# Patient Record
Sex: Female | Born: 1955 | Race: White | Hispanic: No | Marital: Married | Smoking: Never smoker
Health system: Southern US, Community
[De-identification: ages and names within clinical notes are randomized; demographics above are authoritative.]

## PROBLEM LIST (undated history)

## (undated) DIAGNOSIS — I700 Atherosclerosis of aorta: Secondary | ICD-10-CM

## (undated) DIAGNOSIS — C20 Malignant neoplasm of rectum: Secondary | ICD-10-CM

## (undated) DIAGNOSIS — R002 Palpitations: Secondary | ICD-10-CM

## (undated) DIAGNOSIS — Z803 Family history of malignant neoplasm of breast: Secondary | ICD-10-CM

## (undated) DIAGNOSIS — K219 Gastro-esophageal reflux disease without esophagitis: Secondary | ICD-10-CM

## (undated) DIAGNOSIS — Z86718 Personal history of other venous thrombosis and embolism: Secondary | ICD-10-CM

## (undated) DIAGNOSIS — Z8043 Family history of malignant neoplasm of testis: Secondary | ICD-10-CM

## (undated) DIAGNOSIS — R0789 Other chest pain: Secondary | ICD-10-CM

## (undated) DIAGNOSIS — I739 Peripheral vascular disease, unspecified: Secondary | ICD-10-CM

## (undated) DIAGNOSIS — Z800 Family history of malignant neoplasm of digestive organs: Secondary | ICD-10-CM

## (undated) DIAGNOSIS — C189 Malignant neoplasm of colon, unspecified: Secondary | ICD-10-CM

## (undated) HISTORY — DX: Malignant neoplasm of rectum: C20

## (undated) HISTORY — DX: Gastro-esophageal reflux disease without esophagitis: K21.9

## (undated) HISTORY — DX: Family history of malignant neoplasm of digestive organs: Z80.0

## (undated) HISTORY — DX: Atherosclerosis of aorta: I70.0

## (undated) HISTORY — DX: Family history of malignant neoplasm of testis: Z80.43

## (undated) HISTORY — DX: Family history of malignant neoplasm of breast: Z80.3

## (undated) HISTORY — PX: SMALL INTESTINE SURGERY: SHX150

## (undated) HISTORY — DX: Personal history of other venous thrombosis and embolism: Z86.718

## (undated) HISTORY — DX: Peripheral vascular disease, unspecified: I73.9

## (undated) HISTORY — DX: Other chest pain: R07.89

## (undated) HISTORY — DX: Malignant neoplasm of colon, unspecified: C18.9

## (undated) HISTORY — DX: Palpitations: R00.2

---

## 1998-06-11 ENCOUNTER — Other Ambulatory Visit: Admission: RE | Admit: 1998-06-11 | Discharge: 1998-06-11 | Payer: Self-pay | Admitting: Obstetrics & Gynecology

## 1998-10-16 ENCOUNTER — Encounter: Payer: Self-pay | Admitting: Obstetrics & Gynecology

## 1998-10-16 ENCOUNTER — Ambulatory Visit (HOSPITAL_COMMUNITY): Admission: RE | Admit: 1998-10-16 | Discharge: 1998-10-16 | Payer: Self-pay | Admitting: Obstetrics & Gynecology

## 1998-10-30 ENCOUNTER — Encounter: Payer: Self-pay | Admitting: Obstetrics & Gynecology

## 1998-10-30 ENCOUNTER — Ambulatory Visit (HOSPITAL_COMMUNITY): Admission: RE | Admit: 1998-10-30 | Discharge: 1998-10-30 | Payer: Self-pay | Admitting: Obstetrics & Gynecology

## 1998-12-11 ENCOUNTER — Ambulatory Visit (HOSPITAL_COMMUNITY): Admission: RE | Admit: 1998-12-11 | Discharge: 1998-12-11 | Payer: Self-pay | Admitting: Gastroenterology

## 1999-06-12 ENCOUNTER — Other Ambulatory Visit: Admission: RE | Admit: 1999-06-12 | Discharge: 1999-06-12 | Payer: Self-pay | Admitting: Obstetrics & Gynecology

## 1999-11-05 ENCOUNTER — Encounter: Payer: Self-pay | Admitting: Obstetrics & Gynecology

## 1999-11-05 ENCOUNTER — Ambulatory Visit (HOSPITAL_COMMUNITY): Admission: RE | Admit: 1999-11-05 | Discharge: 1999-11-05 | Payer: Self-pay | Admitting: Obstetrics & Gynecology

## 2000-07-23 ENCOUNTER — Other Ambulatory Visit: Admission: RE | Admit: 2000-07-23 | Discharge: 2000-07-23 | Payer: Self-pay | Admitting: Obstetrics & Gynecology

## 2000-12-22 ENCOUNTER — Encounter: Admission: RE | Admit: 2000-12-22 | Discharge: 2000-12-22 | Payer: Self-pay | Admitting: Obstetrics & Gynecology

## 2000-12-22 ENCOUNTER — Encounter: Payer: Self-pay | Admitting: Obstetrics & Gynecology

## 2001-06-29 ENCOUNTER — Encounter: Admission: RE | Admit: 2001-06-29 | Discharge: 2001-06-29 | Payer: Self-pay | Admitting: Obstetrics & Gynecology

## 2001-06-29 ENCOUNTER — Encounter: Payer: Self-pay | Admitting: Obstetrics & Gynecology

## 2001-09-06 ENCOUNTER — Other Ambulatory Visit: Admission: RE | Admit: 2001-09-06 | Discharge: 2001-09-06 | Payer: Self-pay | Admitting: Obstetrics & Gynecology

## 2002-03-13 ENCOUNTER — Encounter: Admission: RE | Admit: 2002-03-13 | Discharge: 2002-03-13 | Payer: Self-pay | Admitting: Obstetrics & Gynecology

## 2002-03-13 ENCOUNTER — Encounter: Payer: Self-pay | Admitting: Obstetrics & Gynecology

## 2002-09-20 ENCOUNTER — Other Ambulatory Visit: Admission: RE | Admit: 2002-09-20 | Discharge: 2002-09-20 | Payer: Self-pay | Admitting: Obstetrics & Gynecology

## 2003-03-29 ENCOUNTER — Encounter: Admission: RE | Admit: 2003-03-29 | Discharge: 2003-03-29 | Payer: Self-pay | Admitting: Obstetrics & Gynecology

## 2003-03-29 ENCOUNTER — Encounter: Payer: Self-pay | Admitting: Obstetrics & Gynecology

## 2003-10-10 ENCOUNTER — Other Ambulatory Visit: Admission: RE | Admit: 2003-10-10 | Discharge: 2003-10-10 | Payer: Self-pay | Admitting: Obstetrics & Gynecology

## 2004-04-08 ENCOUNTER — Encounter: Admission: RE | Admit: 2004-04-08 | Discharge: 2004-04-08 | Payer: Self-pay | Admitting: Obstetrics & Gynecology

## 2004-11-05 ENCOUNTER — Other Ambulatory Visit: Admission: RE | Admit: 2004-11-05 | Discharge: 2004-11-05 | Payer: Self-pay | Admitting: Obstetrics & Gynecology

## 2005-05-01 ENCOUNTER — Encounter: Admission: RE | Admit: 2005-05-01 | Discharge: 2005-05-01 | Payer: Self-pay | Admitting: Obstetrics & Gynecology

## 2007-04-13 ENCOUNTER — Encounter: Admission: RE | Admit: 2007-04-13 | Discharge: 2007-04-13 | Payer: Self-pay | Admitting: Obstetrics & Gynecology

## 2008-07-02 ENCOUNTER — Encounter: Admission: RE | Admit: 2008-07-02 | Discharge: 2008-07-02 | Payer: Self-pay | Admitting: Obstetrics & Gynecology

## 2009-07-05 ENCOUNTER — Encounter: Admission: RE | Admit: 2009-07-05 | Discharge: 2009-07-05 | Payer: Self-pay | Admitting: Obstetrics & Gynecology

## 2009-08-17 DIAGNOSIS — C189 Malignant neoplasm of colon, unspecified: Secondary | ICD-10-CM

## 2009-08-17 DIAGNOSIS — Z9221 Personal history of antineoplastic chemotherapy: Secondary | ICD-10-CM

## 2009-08-17 HISTORY — DX: Personal history of antineoplastic chemotherapy: Z92.21

## 2009-08-17 HISTORY — DX: Malignant neoplasm of colon, unspecified: C18.9

## 2009-08-29 ENCOUNTER — Encounter: Payer: Self-pay | Admitting: Cardiology

## 2009-09-18 ENCOUNTER — Encounter: Payer: Self-pay | Admitting: Cardiology

## 2009-09-19 ENCOUNTER — Ambulatory Visit: Payer: Self-pay | Admitting: Cardiology

## 2009-09-19 DIAGNOSIS — R079 Chest pain, unspecified: Secondary | ICD-10-CM | POA: Insufficient documentation

## 2009-09-19 DIAGNOSIS — F988 Other specified behavioral and emotional disorders with onset usually occurring in childhood and adolescence: Secondary | ICD-10-CM | POA: Insufficient documentation

## 2009-09-19 DIAGNOSIS — R002 Palpitations: Secondary | ICD-10-CM | POA: Insufficient documentation

## 2009-09-19 DIAGNOSIS — R011 Cardiac murmur, unspecified: Secondary | ICD-10-CM | POA: Insufficient documentation

## 2009-09-20 ENCOUNTER — Observation Stay (HOSPITAL_COMMUNITY): Admission: EM | Admit: 2009-09-20 | Discharge: 2009-09-20 | Payer: Self-pay | Admitting: Emergency Medicine

## 2009-09-20 ENCOUNTER — Ambulatory Visit: Payer: Self-pay | Admitting: Internal Medicine

## 2009-10-14 ENCOUNTER — Encounter: Payer: Self-pay | Admitting: Cardiology

## 2009-10-14 ENCOUNTER — Ambulatory Visit (HOSPITAL_COMMUNITY): Admission: RE | Admit: 2009-10-14 | Discharge: 2009-10-14 | Payer: Self-pay | Admitting: Cardiology

## 2009-10-14 ENCOUNTER — Ambulatory Visit: Payer: Self-pay | Admitting: Internal Medicine

## 2009-10-14 ENCOUNTER — Ambulatory Visit: Payer: Self-pay

## 2009-10-15 ENCOUNTER — Ambulatory Visit: Payer: Self-pay | Admitting: Cardiology

## 2009-11-01 ENCOUNTER — Ambulatory Visit: Payer: Self-pay | Admitting: Cardiology

## 2009-11-06 ENCOUNTER — Telehealth: Payer: Self-pay | Admitting: Cardiology

## 2009-11-11 ENCOUNTER — Ambulatory Visit: Payer: Self-pay | Admitting: Cardiology

## 2009-11-20 ENCOUNTER — Ambulatory Visit: Payer: Self-pay | Admitting: Cardiology

## 2009-11-21 ENCOUNTER — Encounter: Payer: Self-pay | Admitting: Cardiology

## 2009-11-22 ENCOUNTER — Encounter: Payer: Self-pay | Admitting: Cardiology

## 2009-12-20 ENCOUNTER — Ambulatory Visit: Payer: Self-pay | Admitting: Cardiology

## 2009-12-31 ENCOUNTER — Ambulatory Visit: Payer: Self-pay | Admitting: Hematology and Oncology

## 2010-01-01 ENCOUNTER — Telehealth (INDEPENDENT_AMBULATORY_CARE_PROVIDER_SITE_OTHER): Payer: Self-pay | Admitting: *Deleted

## 2010-01-03 LAB — CBC WITH DIFFERENTIAL/PLATELET
BASO%: 0.2 % (ref 0.0–2.0)
EOS%: 0.5 % (ref 0.0–7.0)
HCT: 36.3 % (ref 34.8–46.6)
MCH: 30.7 pg (ref 25.1–34.0)
MCHC: 34.7 g/dL (ref 31.5–36.0)
NEUT%: 67.8 % (ref 38.4–76.8)
RBC: 4.1 10*6/uL (ref 3.70–5.45)
lymph#: 1.4 10*3/uL (ref 0.9–3.3)

## 2010-01-03 LAB — COMPREHENSIVE METABOLIC PANEL
ALT: 10 U/L (ref 0–35)
AST: 11 U/L (ref 0–37)
Calcium: 9.9 mg/dL (ref 8.4–10.5)
Chloride: 104 mEq/L (ref 96–112)
Creatinine, Ser: 0.87 mg/dL (ref 0.40–1.20)
Sodium: 139 mEq/L (ref 135–145)
Total Bilirubin: 0.7 mg/dL (ref 0.3–1.2)

## 2010-01-06 ENCOUNTER — Ambulatory Visit (HOSPITAL_COMMUNITY): Admission: RE | Admit: 2010-01-06 | Discharge: 2010-01-06 | Payer: Self-pay | Admitting: Hematology and Oncology

## 2010-01-07 ENCOUNTER — Ambulatory Visit: Admission: RE | Admit: 2010-01-07 | Discharge: 2010-03-07 | Payer: Self-pay | Admitting: Radiation Oncology

## 2010-01-08 ENCOUNTER — Ambulatory Visit (HOSPITAL_COMMUNITY): Admission: RE | Admit: 2010-01-08 | Discharge: 2010-01-08 | Payer: Self-pay | Admitting: Gastroenterology

## 2010-01-10 ENCOUNTER — Ambulatory Visit (HOSPITAL_COMMUNITY): Admission: RE | Admit: 2010-01-10 | Discharge: 2010-01-10 | Payer: Self-pay | Admitting: Hematology and Oncology

## 2010-01-21 ENCOUNTER — Ambulatory Visit (HOSPITAL_COMMUNITY): Admission: RE | Admit: 2010-01-21 | Discharge: 2010-01-21 | Payer: Self-pay | Admitting: Hematology and Oncology

## 2010-01-27 LAB — BASIC METABOLIC PANEL
BUN: 12 mg/dL (ref 6–23)
Calcium: 9.2 mg/dL (ref 8.4–10.5)
Chloride: 105 mEq/L (ref 96–112)
Creatinine, Ser: 0.69 mg/dL (ref 0.40–1.20)

## 2010-01-27 LAB — CBC WITH DIFFERENTIAL/PLATELET
BASO%: 0.4 % (ref 0.0–2.0)
Basophils Absolute: 0 10*3/uL (ref 0.0–0.1)
EOS%: 1.5 % (ref 0.0–7.0)
HCT: 36.3 % (ref 34.8–46.6)
HGB: 12.4 g/dL (ref 11.6–15.9)
LYMPH%: 24 % (ref 14.0–49.7)
MCH: 29.7 pg (ref 25.1–34.0)
MCHC: 34.2 g/dL (ref 31.5–36.0)
MCV: 87.1 fL (ref 79.5–101.0)
NEUT%: 65.7 % (ref 38.4–76.8)
Platelets: 263 10*3/uL (ref 145–400)
lymph#: 1.7 10*3/uL (ref 0.9–3.3)

## 2010-01-30 ENCOUNTER — Ambulatory Visit: Payer: Self-pay | Admitting: Hematology and Oncology

## 2010-02-03 LAB — CBC WITH DIFFERENTIAL/PLATELET
EOS%: 2.1 % (ref 0.0–7.0)
Eosinophils Absolute: 0.1 10*3/uL (ref 0.0–0.5)
HGB: 12.4 g/dL (ref 11.6–15.9)
MCH: 29.9 pg (ref 25.1–34.0)
MCV: 87.5 fL (ref 79.5–101.0)
MONO%: 9 % (ref 0.0–14.0)
NEUT#: 3.8 10*3/uL (ref 1.5–6.5)
RBC: 4.15 10*6/uL (ref 3.70–5.45)
RDW: 12.6 % (ref 11.2–14.5)
lymph#: 0.8 10*3/uL — ABNORMAL LOW (ref 0.9–3.3)

## 2010-02-03 LAB — BASIC METABOLIC PANEL
Chloride: 104 mEq/L (ref 96–112)
Potassium: 3.8 mEq/L (ref 3.5–5.3)
Sodium: 137 mEq/L (ref 135–145)

## 2010-02-10 LAB — BASIC METABOLIC PANEL
BUN: 14 mg/dL (ref 6–23)
CO2: 24 mEq/L (ref 19–32)
Calcium: 9.2 mg/dL (ref 8.4–10.5)
Creatinine, Ser: 0.74 mg/dL (ref 0.40–1.20)
Glucose, Bld: 103 mg/dL — ABNORMAL HIGH (ref 70–99)
Sodium: 140 mEq/L (ref 135–145)

## 2010-02-10 LAB — CBC WITH DIFFERENTIAL/PLATELET
Basophils Absolute: 0 10*3/uL (ref 0.0–0.1)
Eosinophils Absolute: 0.1 10*3/uL (ref 0.0–0.5)
HCT: 34.2 % — ABNORMAL LOW (ref 34.8–46.6)
LYMPH%: 11.5 % — ABNORMAL LOW (ref 14.0–49.7)
MCV: 89 fL (ref 79.5–101.0)
MONO#: 0.5 10*3/uL (ref 0.1–0.9)
NEUT#: 3.3 10*3/uL (ref 1.5–6.5)
NEUT%: 75.3 % (ref 38.4–76.8)
Platelets: 242 10*3/uL (ref 145–400)
WBC: 4.4 10*3/uL (ref 3.9–10.3)

## 2010-02-18 LAB — CBC WITH DIFFERENTIAL/PLATELET
BASO%: 0.2 % (ref 0.0–2.0)
EOS%: 2.3 % (ref 0.0–7.0)
HCT: 34 % — ABNORMAL LOW (ref 34.8–46.6)
LYMPH%: 6.9 % — ABNORMAL LOW (ref 14.0–49.7)
MCH: 30.1 pg (ref 25.1–34.0)
MCHC: 34.1 g/dL (ref 31.5–36.0)
MCV: 88.3 fL (ref 79.5–101.0)
MONO%: 12.4 % (ref 0.0–14.0)
NEUT%: 78.2 % — ABNORMAL HIGH (ref 38.4–76.8)
lymph#: 0.4 10*3/uL — ABNORMAL LOW (ref 0.9–3.3)

## 2010-02-18 LAB — BASIC METABOLIC PANEL
Calcium: 9.7 mg/dL (ref 8.4–10.5)
Chloride: 104 mEq/L (ref 96–112)
Creatinine, Ser: 0.81 mg/dL (ref 0.40–1.20)

## 2010-02-24 LAB — BASIC METABOLIC PANEL
BUN: 14 mg/dL (ref 6–23)
Chloride: 103 mEq/L (ref 96–112)
Glucose, Bld: 100 mg/dL — ABNORMAL HIGH (ref 70–99)
Potassium: 4.3 mEq/L (ref 3.5–5.3)
Sodium: 138 mEq/L (ref 135–145)

## 2010-02-24 LAB — CBC WITH DIFFERENTIAL/PLATELET
Basophils Absolute: 0 10*3/uL (ref 0.0–0.1)
Eosinophils Absolute: 0.1 10*3/uL (ref 0.0–0.5)
HGB: 11.6 g/dL (ref 11.6–15.9)
MONO#: 0.7 10*3/uL (ref 0.1–0.9)
NEUT#: 4.2 10*3/uL (ref 1.5–6.5)
RBC: 3.64 10*6/uL — ABNORMAL LOW (ref 3.70–5.45)
RDW: 15.3 % — ABNORMAL HIGH (ref 11.2–14.5)
WBC: 5.3 10*3/uL (ref 3.9–10.3)
lymph#: 0.2 10*3/uL — ABNORMAL LOW (ref 0.9–3.3)

## 2010-02-25 LAB — URINALYSIS, MICROSCOPIC - CHCC
Glucose: NEGATIVE g/dL
Ketones: NEGATIVE mg/dL
Nitrite: NEGATIVE
Specific Gravity, Urine: 1.02 (ref 1.003–1.035)
pH: 6 (ref 4.6–8.0)

## 2010-03-03 ENCOUNTER — Ambulatory Visit: Payer: Self-pay | Admitting: Hematology and Oncology

## 2010-03-05 LAB — CBC WITH DIFFERENTIAL/PLATELET
Basophils Absolute: 0 10*3/uL (ref 0.0–0.1)
Eosinophils Absolute: 0.4 10*3/uL (ref 0.0–0.5)
HGB: 12.2 g/dL (ref 11.6–15.9)
MCV: 92.6 fL (ref 79.5–101.0)
MONO#: 0.6 10*3/uL (ref 0.1–0.9)
MONO%: 13.2 % (ref 0.0–14.0)
NEUT#: 3.4 10*3/uL (ref 1.5–6.5)
Platelets: 350 10*3/uL (ref 145–400)
RBC: 3.84 10*6/uL (ref 3.70–5.45)
RDW: 17.6 % — ABNORMAL HIGH (ref 11.2–14.5)
WBC: 4.7 10*3/uL (ref 3.9–10.3)

## 2010-03-05 LAB — COMPREHENSIVE METABOLIC PANEL
ALT: 16 U/L (ref 0–35)
AST: 12 U/L (ref 0–37)
Alkaline Phosphatase: 43 U/L (ref 39–117)
BUN: 15 mg/dL (ref 6–23)
Chloride: 105 mEq/L (ref 96–112)
Creatinine, Ser: 1.04 mg/dL (ref 0.40–1.20)
Total Bilirubin: 0.5 mg/dL (ref 0.3–1.2)

## 2010-03-07 ENCOUNTER — Telehealth: Payer: Self-pay | Admitting: Cardiology

## 2010-04-01 ENCOUNTER — Ambulatory Visit (HOSPITAL_COMMUNITY): Admission: RE | Admit: 2010-04-01 | Discharge: 2010-04-01 | Payer: Self-pay | Admitting: Hematology and Oncology

## 2010-04-01 LAB — COMPREHENSIVE METABOLIC PANEL
ALT: 23 U/L (ref 0–35)
AST: 23 U/L (ref 0–37)
Calcium: 9.2 mg/dL (ref 8.4–10.5)
Chloride: 105 mEq/L (ref 96–112)
Creatinine, Ser: 0.76 mg/dL (ref 0.40–1.20)
Potassium: 3.8 mEq/L (ref 3.5–5.3)

## 2010-04-01 LAB — CEA: CEA: 0.7 ng/mL (ref 0.0–5.0)

## 2010-04-01 LAB — CBC WITH DIFFERENTIAL/PLATELET
BASO%: 0.3 % (ref 0.0–2.0)
MCHC: 35.3 g/dL (ref 31.5–36.0)
MONO#: 0.4 10*3/uL (ref 0.1–0.9)
RBC: 3.81 10*6/uL (ref 3.70–5.45)
WBC: 4.1 10*3/uL (ref 3.9–10.3)
lymph#: 0.4 10*3/uL — ABNORMAL LOW (ref 0.9–3.3)

## 2010-04-02 ENCOUNTER — Ambulatory Visit: Payer: Self-pay | Admitting: Hematology and Oncology

## 2010-06-04 ENCOUNTER — Ambulatory Visit: Payer: Self-pay | Admitting: Hematology and Oncology

## 2010-06-06 LAB — COMPREHENSIVE METABOLIC PANEL
Albumin: 3.9 g/dL (ref 3.5–5.2)
BUN: 11 mg/dL (ref 6–23)
CO2: 25 mEq/L (ref 19–32)
Calcium: 9.4 mg/dL (ref 8.4–10.5)
Chloride: 105 mEq/L (ref 96–112)
Glucose, Bld: 91 mg/dL (ref 70–99)
Potassium: 4 mEq/L (ref 3.5–5.3)
Sodium: 142 mEq/L (ref 135–145)
Total Protein: 6.3 g/dL (ref 6.0–8.3)

## 2010-06-06 LAB — CBC WITH DIFFERENTIAL/PLATELET
Basophils Absolute: 0 10*3/uL (ref 0.0–0.1)
Eosinophils Absolute: 0.1 10*3/uL (ref 0.0–0.5)
HGB: 10.7 g/dL — ABNORMAL LOW (ref 11.6–15.9)
MCV: 88 fL (ref 79.5–101.0)
MONO#: 0.4 10*3/uL (ref 0.1–0.9)
NEUT#: 2.9 10*3/uL (ref 1.5–6.5)
Platelets: 381 10*3/uL (ref 145–400)
RBC: 3.59 10*6/uL — ABNORMAL LOW (ref 3.70–5.45)
RDW: 13.7 % (ref 11.2–14.5)
WBC: 3.8 10*3/uL — ABNORMAL LOW (ref 3.9–10.3)

## 2010-06-06 LAB — CEA: CEA: <0.5 ng/mL (ref 0.0–5.0)

## 2010-07-04 ENCOUNTER — Ambulatory Visit: Payer: Self-pay | Admitting: Hematology and Oncology

## 2010-07-07 ENCOUNTER — Encounter: Admission: RE | Admit: 2010-07-07 | Discharge: 2010-07-07 | Payer: Self-pay | Admitting: Obstetrics & Gynecology

## 2010-07-08 LAB — CBC WITH DIFFERENTIAL/PLATELET
BASO%: 0.2 % (ref 0.0–2.0)
EOS%: 2.6 % (ref 0.0–7.0)
HCT: 31.6 % — ABNORMAL LOW (ref 34.8–46.6)
MCH: 29.5 pg (ref 25.1–34.0)
MCHC: 34.3 g/dL (ref 31.5–36.0)
MONO#: 0.3 10*3/uL (ref 0.1–0.9)
NEUT%: 69.8 % (ref 38.4–76.8)
RBC: 3.69 10*6/uL — ABNORMAL LOW (ref 3.70–5.45)
RDW: 14.1 % (ref 11.2–14.5)
WBC: 3.4 10*3/uL — ABNORMAL LOW (ref 3.9–10.3)
lymph#: 0.6 10*3/uL — ABNORMAL LOW (ref 0.9–3.3)

## 2010-07-09 LAB — COMPREHENSIVE METABOLIC PANEL
ALT: 8 U/L (ref 0–35)
AST: 13 U/L (ref 0–37)
Calcium: 9.3 mg/dL (ref 8.4–10.5)
Chloride: 105 mEq/L (ref 96–112)
Creatinine, Ser: 0.79 mg/dL (ref 0.40–1.20)
Sodium: 140 mEq/L (ref 135–145)
Total Protein: 6.3 g/dL (ref 6.0–8.3)

## 2010-07-29 LAB — CBC WITH DIFFERENTIAL/PLATELET
Basophils Absolute: 0 10*3/uL (ref 0.0–0.1)
EOS%: 4.5 % (ref 0.0–7.0)
HCT: 33.9 % — ABNORMAL LOW (ref 34.8–46.6)
HGB: 11.6 g/dL (ref 11.6–15.9)
MCH: 29.8 pg (ref 25.1–34.0)
MCV: 86.7 fL (ref 79.5–101.0)
MONO%: 9.1 % (ref 0.0–14.0)
NEUT%: 72.6 % (ref 38.4–76.8)

## 2010-07-29 LAB — COMPREHENSIVE METABOLIC PANEL
ALT: 19 U/L (ref 0–35)
AST: 18 U/L (ref 0–37)
Albumin: 4.1 g/dL (ref 3.5–5.2)
BUN: 11 mg/dL (ref 6–23)
CO2: 24 mEq/L (ref 19–32)
Calcium: 9.2 mg/dL (ref 8.4–10.5)
Chloride: 107 mEq/L (ref 96–112)
Creatinine, Ser: 0.87 mg/dL (ref 0.40–1.20)
Potassium: 3.8 mEq/L (ref 3.5–5.3)

## 2010-08-07 ENCOUNTER — Ambulatory Visit (HOSPITAL_BASED_OUTPATIENT_CLINIC_OR_DEPARTMENT_OTHER): Payer: BC Managed Care – PPO | Admitting: Hematology and Oncology

## 2010-08-08 LAB — COMPREHENSIVE METABOLIC PANEL
ALT: 11 U/L (ref 0–35)
AST: 17 U/L (ref 0–37)
Albumin: 4.1 g/dL (ref 3.5–5.2)
CO2: 28 mEq/L (ref 19–32)
Calcium: 9.4 mg/dL (ref 8.4–10.5)
Chloride: 105 mEq/L (ref 96–112)
Potassium: 4.2 mEq/L (ref 3.5–5.3)
Sodium: 141 mEq/L (ref 135–145)
Total Protein: 6.3 g/dL (ref 6.0–8.3)

## 2010-08-08 LAB — CBC WITH DIFFERENTIAL/PLATELET
BASO%: 0.4 % (ref 0.0–2.0)
EOS%: 5.4 % (ref 0.0–7.0)
HCT: 32.2 % — ABNORMAL LOW (ref 34.8–46.6)
HGB: 11.2 g/dL — ABNORMAL LOW (ref 11.6–15.9)
MCH: 30.2 pg (ref 25.1–34.0)
MCHC: 34.7 g/dL (ref 31.5–36.0)
MONO#: 0.5 10*3/uL (ref 0.1–0.9)
NEUT%: 59 % (ref 38.4–76.8)
RDW: 22.2 % — ABNORMAL HIGH (ref 11.2–14.5)
WBC: 3.7 10*3/uL — ABNORMAL LOW (ref 3.9–10.3)
lymph#: 0.8 10*3/uL — ABNORMAL LOW (ref 0.9–3.3)

## 2010-08-29 LAB — CBC WITH DIFFERENTIAL/PLATELET
BASO%: 0.2 % (ref 0.0–2.0)
Basophils Absolute: 0 10*3/uL (ref 0.0–0.1)
EOS%: 4 % (ref 0.0–7.0)
Eosinophils Absolute: 0.1 10*3/uL (ref 0.0–0.5)
HCT: 34.4 % — ABNORMAL LOW (ref 34.8–46.6)
HGB: 11.8 g/dL (ref 11.6–15.9)
LYMPH%: 22.7 % (ref 14.0–49.7)
MCH: 30.6 pg (ref 25.1–34.0)
MCHC: 34.2 g/dL (ref 31.5–36.0)
MCV: 89.7 fL (ref 79.5–101.0)
MONO#: 0.4 10*3/uL (ref 0.1–0.9)
MONO%: 11.5 % (ref 0.0–14.0)
NEUT#: 2.2 10*3/uL (ref 1.5–6.5)
NEUT%: 61.6 % (ref 38.4–76.8)
Platelets: 262 10*3/uL (ref 145–400)
RBC: 3.83 10*6/uL (ref 3.70–5.45)
RDW: 25.5 % — ABNORMAL HIGH (ref 11.2–14.5)
WBC: 3.6 10*3/uL — ABNORMAL LOW (ref 3.9–10.3)
lymph#: 0.8 10*3/uL — ABNORMAL LOW (ref 0.9–3.3)

## 2010-08-29 LAB — COMPREHENSIVE METABOLIC PANEL
ALT: 11 U/L (ref 0–35)
AST: 15 U/L (ref 0–37)
Albumin: 4.5 g/dL (ref 3.5–5.2)
Alkaline Phosphatase: 62 U/L (ref 39–117)
BUN: 13 mg/dL (ref 6–23)
CO2: 22 mEq/L (ref 19–32)
Calcium: 9.3 mg/dL (ref 8.4–10.5)
Chloride: 106 mEq/L (ref 96–112)
Creatinine, Ser: 0.76 mg/dL (ref 0.40–1.20)
Glucose, Bld: 108 mg/dL — ABNORMAL HIGH (ref 70–99)
Potassium: 4.4 mEq/L (ref 3.5–5.3)
Sodium: 142 mEq/L (ref 135–145)
Total Bilirubin: 0.6 mg/dL (ref 0.3–1.2)
Total Protein: 6.7 g/dL (ref 6.0–8.3)

## 2010-09-14 ENCOUNTER — Emergency Department (HOSPITAL_COMMUNITY)
Admission: EM | Admit: 2010-09-14 | Discharge: 2010-09-14 | Payer: Self-pay | Source: Home / Self Care | Admitting: Emergency Medicine

## 2010-09-14 LAB — CBC
HCT: 33.9 % — ABNORMAL LOW (ref 36.0–46.0)
Hemoglobin: 11.9 g/dL — ABNORMAL LOW (ref 12.0–15.0)
MCHC: 35.1 g/dL (ref 30.0–36.0)
RDW: 21.4 % — ABNORMAL HIGH (ref 11.5–15.5)
WBC: 3.3 10*3/uL — ABNORMAL LOW (ref 4.0–10.5)

## 2010-09-14 LAB — DIFFERENTIAL
Basophils Absolute: 0 10*3/uL (ref 0.0–0.1)
Eosinophils Relative: 3 % (ref 0–5)
Lymphs Abs: 0.3 10*3/uL — ABNORMAL LOW (ref 0.7–4.0)
Monocytes Relative: 18 % — ABNORMAL HIGH (ref 3–12)
Neutrophils Relative %: 69 % (ref 43–77)

## 2010-09-14 LAB — COMPREHENSIVE METABOLIC PANEL
BUN: 6 mg/dL (ref 6–23)
CO2: 27 mEq/L (ref 19–32)
Calcium: 9.2 mg/dL (ref 8.4–10.5)
Chloride: 105 mEq/L (ref 96–112)
Creatinine, Ser: 0.89 mg/dL (ref 0.4–1.2)
GFR calc non Af Amer: >60 mL/min (ref >60)
Glucose, Bld: 122 mg/dL — ABNORMAL HIGH (ref 70–99)
Total Bilirubin: 0.8 mg/dL (ref 0.3–1.2)

## 2010-09-18 NOTE — Letter (Signed)
Summary: Generic Letter-letter for event monitor  [employer], Main Office  [address]   [place], [place] [address]   Phone: [phone]  Fax: [phone]        November 11, 2009 MRN: [id number]    Colleen Kirby [address] [place], [place]  [address]    A recent 48 hour monitor showed SVT vs atrial flutter. I would like for Ms. Zuleta  to have a 3 week event monitor to determine atrial flutter burden.      Sincerely,    Daltom McLean,MD  This letter has been electronically signed by your physician.

## 2010-09-18 NOTE — Progress Notes (Signed)
  Phone Note Call from Patient   Caller: PT Initial call taken by: Denny Peon    PT called @ 9:00 this am asking if Records were copied I let her know I would Speak W/ Gina and call he back..Talked w/ Almira Coaster she has records copied..Called Pt back @ 9:40 to let her know she can come Pick up Records, She stated she will pick up this afternoon. Colleen Kirby  Jan 01, 2010 9:47 AM

## 2010-09-18 NOTE — Consult Note (Signed)
Summary: Colleen Kirby at [facility] Referral Form   Imported By: Roderic Ovens 09/25/2009 13:30:29  _____________________________________________________________________  External Attachment:    Type:   Image     Comment:   External Document

## 2010-09-18 NOTE — Miscellaneous (Signed)
Summary: Orders Update  Clinical Lists Changes  Orders: Added new Test order of T- * Misc. Laboratory test [id number]) - Signed

## 2010-09-18 NOTE — Assessment & Plan Note (Signed)
Summary: rov   Primary Provider:  Dr. Zachery Dauer @ [facility]   History of Present Illness: 55 yo presented for evaluation of chest pain and palpitations. Stress echocardiogram was normal.  She was noted on 48 hour holter monitor to have episode of SVT (possible atrial flutter) at a rate of 150 bpm. She continues to have occasional palpitations (mild heart fluttering) as well as twinges of atypical chest pain.  She has been avoiding caffeine and over-the-counter cold meds. She is very worried about the possibility of atrial fibrillation (her mother had this).     Current Medications (verified): 1)  Aspirin 81 Mg  Tabs (Aspirin) .Marland Kitchen.. 1 Tab Qd 2)  Protonix 40 Mg Tbec (Pantoprazole Sodium) .... Daily 3)  Amoxicillin .Marland Kitchen.. 1 Tab By Mouth Two Times A Day For 3 More Days  Allergies: No Known Drug Allergies  Past History:  Past Medical History: 1. GERD 2. Atypical chest pain: Stress echo (2/11) showed EF 60%, normal valves and normal RV.  There was no evidence for ischemia with stress: all walls augmented appropriately.    3. Palpitations: Holter monitor (3/11) showed 1 run of SVT (possible atrial fiutter) at a rate of 150 bpm.   4. DVT several years ago when on HRT.  Took coumadin for a few months.   Family History: Reviewed history from 10/15/2009 and no changes required. Father was found to have CAD in his early 67s and had CABG at 67.  Grandfather had CABG at 72.  2 brothers are healthy.  Mother with atrial fibrillation.   Social History: Reviewed history from 10/15/2009 and no changes required. Married, not working Tobacco Use - No.  Alcohol Use - no  Review of Systems       All systems reviewed and negative except as per HPI.   Vital Signs:  Patient profile:   55 year old female Height:      66 inches Weight:      160 pounds BMI:     25.92 Pulse rate:   90 / minute Resp:     12 per minute BP sitting:   113 / 74  (left arm)  Vitals Entered By: Kem Parkinson (November 11, 2009  10:23 AM)  Physical Exam  General:  Well developed, well nourished, in no acute distress. Neck:  Neck supple, no JVD. No masses, thyromegaly or abnormal cervical nodes. Lungs:  Clear bilaterally to auscultation and percussion. Heart:  Non-displaced PMI, chest non-tender; regular rate and rhythm, S1, S2 without rubs or gallops. 1/6 systolic murmur at the upper sternal border.  Carotid upstroke normal, no bruit. Pedals normal pulses. No edema, no varicosities. Abdomen:  Bowel sounds positive; abdomen soft and non-tender without masses, organomegaly, or hernias noted. No hepatosplenomegaly. Extremities:  No clubbing or cyanosis. Neurologic:  Alert and oriented x 3. Psych:  anxious.     Impression & Recommendations:  Problem # 1:  PALPITATIONS (ICD-785.1) Patient was found to have a run of SVT (possible atrial flutter) at a rate of 150 bpm on holter monitor.  I would like to try to confirm the presence of atrial flutter and assess the flutter burden with a 3 week event monitor. Her CHADSVASC score is 0 so I will have her take ASA 325 mg daily.  I will also have her start Toprol XL 25 mg daily. I would like her to do the event monitor while on Toprol XL.   Problem # 2:  FAMILY HISTORY OF CAD Given the patient's family history of  premature CAD, I think it would be reasonable to look more closely into her lipid profile.  I will have her do a Lipomed profile.   Other Orders: Event (Event)  Patient Instructions: 1)  Your physician has recommended you make the following change in your medication:  2)  Increase Apsirin to 325mg  daily 3)  Start Toprol XL 25mg  daily 4)  Your physician has recommended that you wear an event monitor.  Event monitors are medical devices that record the heart's electrical activity. Doctors most often use these monitors to diagnose arrhythmias. Arrhythmias are problems with the speed or rhythm of the heartbeat. The monitor is a small, portable device. You can wear one  while you do your normal daily activities. This is usually used to diagnose what is causing palpitations/syncope (passing out).  HAVE THIS DONE AT LEAST ONE WEEK AFTER BEING ON TORPOL 5)  Your physician recommends that you return for a FASTING LIPOMED PROFILE---785.0 786.50 6)  Your physician recommends that you schedule a follow-up appointment with Dr Shirlee Latch in  4-5 weeks after the monitor has been completed. Prescriptions: TOPROL XL 25 MG XR24H-TAB (METOPROLOL SUCCINATE) one tablet daily  #30 x 6   Entered by:   Katina Dung, RN, BSN   Authorized by:   [name], MD   Signed by:   Katina Dung, RN, BSN on 11/11/2009   Method used:   Electronically to        [facility]  [place] [address] (retail)       [address]       [place], [place]  [address]       Ph: [phone] or [phone]       Fax: [phone]   RxID:   [id number]

## 2010-09-18 NOTE — Progress Notes (Signed)
Summary: low blood pressure-taking metroprolol  Phone Note Call from Patient   Caller: Patient [phone] Reason for Call: Talk to Nurse Summary of Call: pt taking metroprolol-having low blood pressure-oncologist advised her to call here to see if could be med realated-pls call [phone] Initial call taken by: Glynda Jaeger,  March 07, 2010 10:19 AM  Follow-up for Phone Call        Spoke with pt. Patient states has been having low B/P. Yesterday at 10:00Am  B/P was 88/? systalic . She usually takes her Toprol XL 25 mg in the AM. Today  B/P was 88/ ?systalic in the morning  and at lunch time B/P was 109/? Pt. states  when B/P is low, she feels light headed when standing.  Pt's Oncologist adviced pt. to call the office to seen it is medication related. Pt. has not take medication today. Pt. would like to know MD's oppinion whether to take medication. Follow-up by: Ollen Gross, RN, BSN,  March 07, 2010 12:43 PM  Additional Follow-up for Phone Call Additional follow up Details #1::        Dr. Daleen Squibb DOD  recommeds for pt. to take 12.5 mg(1/2 tab) daily to seen if she can tolarate medication. Pt. to call the office if she continue having symptoms . Pt. verbalized understanding. Ollen Gross, RN, BSN  March 07, 2010 1:03 PM  Additional Follow-up by: Gaylord Shih, MD, [facility],  March 07, 2010 1:18 PM

## 2010-09-18 NOTE — Procedures (Signed)
Summary: SUMMARY REPORT  SUMMARY REPORT   Imported By: Mirna Mires 11/12/2009 10:53:10  _____________________________________________________________________  External Attachment:    Type:   Image     Comment:   External Document

## 2010-09-18 NOTE — Assessment & Plan Note (Signed)
Summary: 5 WK F/U   Primary Provider:  Dr. Zachery Dauer @ [facility]  CC:  5 week follow up.  ASA changed from 325 mg to 81 mg per Dr. Kinnie Scales.  History of Present Illness: 55 yo presented for evaluation of chest pain and palpitations. Stress echocardiogram was normal.  She was noted on 48 hour holter monitor to have episode of SVT (possible atrial flutter) at a rate of 150 bpm. She has been avoiding caffeine and over-the-counter cold meds. She is very worried about the possibility of atrial fibrillation (her mother had this).  I started her on low dose Toprol XL (25 mg daily) and her palpitations have decreased considerably.  She did a 3 week event monitor that showed no atrial fibrillation, atrial flutter, or other SVT. Main issue recently has been some rectal bleeding.  Her aspirin was decreased from 325 mg to 81 mg daily.  Plan for colonoscopy.   Labs (4/11): lipomed profile showed LDL 117, HDL 41, LDL size 21, small LDL-P 355.  This is not a high risk profile.   Current Medications (verified): 1)  Aspirin 81 Mg Tabs (Aspirin) .... Once Daily 2)  Protonix 40 Mg Tbec (Pantoprazole Sodium) .... Daily 3)  Toprol Xl 25 Mg Xr24h-Tab (Metoprolol Succinate) .... One Tablet Daily 4)  Fish Oil 1000 Mg Caps (Omega-3 Fatty Acids) .... Take One Capsule Two Times A Day  Allergies (verified): No Known Drug Allergies  Past History:  Past Medical History: 1. GERD 2. Atypical chest pain: Stress echo (2/11) showed EF 60%, normal valves and normal RV.  There was no evidence for ischemia with stress: all walls augmented appropriately.    3. Palpitations: Holter monitor (3/11) showed 1 run of SVT (possible atrial fiutter) at a rate of 150 bpm.  Event monitor (4/11) for 3 wks showed no atrial fib, atrial flutter, or other SVT.  4. DVT several years ago when on HRT.  Took coumadin for a few months.   Family History: Reviewed history from 10/15/2009 and no changes required. Father was found to have CAD in his early  55s and had CABG at 50.  Grandfather had CABG at 55.  2 brothers are healthy.  Mother with atrial fibrillation.   Social History: Reviewed history from 10/15/2009 and no changes required. Married, not working Tobacco Use - No.  Alcohol Use - no  Vital Signs:  Patient profile:   55 year old female Height:      66 inches Weight:      161 pounds BMI:     26.08 Pulse rate:   88 / minute Pulse rhythm:   regular BP sitting:   110 / 74  (left arm) Cuff size:   regular  Vitals Entered By: Judithe Modest CMA (Dec 20, 2009 9:08 AM)  Physical Exam  General:  Well developed, well nourished, in no acute distress. Neck:  Neck supple, no JVD. No masses, thyromegaly or abnormal cervical nodes. Lungs:  Clear bilaterally to auscultation and percussion. Heart:  Non-displaced PMI, chest non-tender; regular rate and rhythm, S1, S2 without rubs or gallops. 1/6 systolic murmur at the upper sternal border.  Carotid upstroke normal, no bruit. Pedals normal pulses. No edema, no varicosities. Abdomen:  Bowel sounds positive; abdomen soft and non-tender without masses, organomegaly, or hernias noted. No hepatosplenomegaly. Extremities:  No clubbing or cyanosis. Neurologic:  Alert and oriented x 3. Psych:  Normal affect.   Impression & Recommendations:  Problem # 1:  PALPITATIONS (ICD-785.1) Patient was found to  have a run of SVT (possible atrial flutter) at a rate of 150 bpm on holter monitor.  Her CHADSVASC score is 0.  I did a 3 week event monitor to assess for arrhythmia burden and no SVT, atrial fib, or atrial flutter was noted.  She is on Toprol XL and is noting less palpitations (feels better).  Given rectal bleeding, it is reasonable for her to decrease ASA to 81 mg daily, and she can come off aspirin around the time of her colonoscopy.    Problem # 2:  CARDIOVASCULAR RISK Patient's father had premature CAD (< 55).  I did a Lipomed profile on the patient.  It was not high risk.  I would recommend  that she continue over the counter fish oil and work on diet/exercise.    Patient Instructions: 1)  Your physician wants you to follow-up in: one year with Dr Shirlee Latch.   You will receive a reminder letter in the mail two months in advance. If you don't receive a letter, please call our office to schedule the follow-up appointment.

## 2010-09-18 NOTE — Assessment & Plan Note (Signed)
Summary: np6/chest pain   Visit Type:  Initial Consult Primary Provider:  Dr. Zachery Dauer @ [facility]  CC:  chest pressure.  History of Present Illness: 55 yo with presents for evaluation of an episode of chest pressure.  Patient has no prior cardiac history.  She awoke 2 nights ago with 3/10 chest pressure in the center of her chest.  This took about 10-15 minutes to resolve.  It was associated with some diaphoresis.  She has never had anything like this before.  She gets GERD symptoms but those tend to be classic burning in the chest to the neck after meals.  This episode at night was very different.  She has not had this sensation before or since.  She has good exercise tolerance and does not get any exertional symptoms.  She reports a history of a heart murmur since childhood.  She had an echo several years ago and thinks everything was ok on the echo.  Finally, she has had palpitations with occasional chest fluttering since she was a teenager.  This symptom has not changed.    ECG: NSR, normal  Current Medications (verified): 1)  None  Allergies (verified): No Known Drug Allergies  Past History:  Past Medical History: 1. GERD 2. Atypical chest pain  Family History: Father was found to have CAD in his early 61s and had CABG at 49.  Grandfather had CABG at 34.  2 brothers are healthy.   Social History: Patient is not working.  She lives in [place].  No smoking and rare ETOH.    Review of Systems       All systems reviewed and negative except as per HPI.   Vital Signs:  Patient profile:   55 year old female Height:      66 inches Weight:      163 pounds BMI:     26.40 Pulse rate:   86 / minute BP sitting:   126 / 70  (left arm)  Vitals Entered By: Laurance Flatten CMA (September 19, 2009 9:34 AM)  Physical Exam  General:  Well developed, well nourished, in no acute distress. Head:  normocephalic and atraumatic Nose:  no deformity, discharge, inflammation, or lesions Mouth:   Teeth, gums and palate normal. Oral mucosa normal. Neck:  Neck supple, no JVD. No masses, thyromegaly or abnormal cervical nodes. Lungs:  Clear bilaterally to auscultation and percussion. Heart:  Non-displaced PMI, chest non-tender; regular rate and rhythm, S1, S2 without rubs or gallops. 1/6 systolic murmur at the upper sternal border.  Carotid upstroke normal, no bruit. Pedals normal pulses. No edema, no varicosities. Abdomen:  Bowel sounds positive; abdomen soft and non-tender without masses, organomegaly, or hernias noted. No hepatosplenomegaly. Msk:  Back normal, normal gait. Muscle strength and tone normal. Extremities:  No clubbing or cyanosis. Neurologic:  Alert and oriented x 3. Skin:  Intact without lesions or rashes. Psych:  Normal affect.   Impression & Recommendations:  Problem # 1:  CHEST PAIN-UNSPECIFIED (ICD-786.50) Atypical chest pain.  Patient woke up at night with chest pressure unlike her prior GERD.  She has no exertional symptoms.  Her only cardiac risk factor is a family history of premature CAD in her father.  I will get her lipids from her PCP (done recently).  I will risk stratify her with a stress echo.  This will also allow [place] to assess her heart murmur.   Problem # 2:  MURMUR (ICD-785.2) Patient says she has had a murmur since childhood.  She has a soft murmur at the upper sternal border.  As above, we will assess this with an echocardiogram.    Other Orders: EKG w/ Interpretation (93000) Stress Echo (Stress Echo) Echocardiogram (Echo)  Patient Instructions: 1)  Your physician recommends that you schedule a follow-up appointment in: as necessary 2)  Your physician has requested that you have a stress echocardiogram. For further information please visit [email or web address]  Please follow instruction sheet as given. 3)  Your physician has requested that you have an echocardiogram.  Echocardiography is a painless test that uses sound waves to create images of  your heart. It provides your doctor with information about the size and shape of your heart and how well your heart's chambers and valves are working.  This procedure takes approximately one hour. There are no restrictions for this procedure.

## 2010-09-18 NOTE — Assessment & Plan Note (Signed)
Summary: eph   Visit Type:  Follow-up Primary Provider:  Dr. Zachery Dauer @ [facility]  CC:  no complaints.  History of Present Illness: 55 yo with presents for evaluation of chest pain.  Patient has no prior cardiac history.  She awoke several weeks ago with 3/10 chest pressure in the center of her chest.  This took about 10-15 minutes to resolve.  It was associated with some diaphoresis.  She has never had anything like this before.  She gets GERD symptoms but those tend to be classic burning in the chest to the neck after meals.  This episode at night was very different.  She has not had this sensation before or since.  She has good exercise tolerance and does not get any exertional symptoms.  She reports a history of a heart murmur since childhood.  She had an echo several years ago and thinks everything was ok on the echo.  Finally, she has had palpitations with occasional chest fluttering since she was a teenager.  This symptom has not changed.    Since I first saw her, she had another episode where she woke up at night with chest pain.  This was severe so she went to the ER and was admitted for about 24 hours.  She ruled out for MI by enzymes and had unremarkable ECGs.  CTA of the chest showed no PE or pneumonia. She was discharged home and recently had a stress echo which showed no evidence for ischemia or infarction.  LV systolic function and valves were all normal.  Patient's chest pain has resolved completely since she has started Protonix and begun using a wedge under her bed.  I think that her symptoms have been due to GERD.  Patient also reports palpitations.  Her heart will race a couple of times a year but has some irregularity daily.  She is worried because her mother was recently diagnosed with atrial fibrillation.   ECG: NSR, normal  Current Medications (verified): 1)  Aspirin 81 Mg  Tabs (Aspirin) .Marland Kitchen.. 1 Tab Qd 2)  Protonix 40 Mg Tbec (Pantoprazole Sodium) .... Daily  Allergies  (verified): No Known Drug Allergies  Past History:  Past Medical History: 1. GERD 2. Atypical chest pain: Stress echo (2/11) showed EF 60%, normal valves and normal RV.  There was no evidence for ischemia with stress: all walls augmented appropriately.    3. Palpitations  Family History: Father was found to have CAD in his early 50s and had CABG at 66.  Grandfather had CABG at 70.  2 brothers are healthy.  Mother with atrial fibrillation.   Social History: Married, not working Tobacco Use - No.  Alcohol Use - no  Review of Systems       All systems reviewed and negative except as per HPI.   Vital Signs:  Patient profile:   55 year old female Height:      66 inches Weight:      164 pounds Pulse rate:   76 / minute BP sitting:   118 / 81  (left arm)  Vitals Entered By: Burnett Kanaris, CNA (October 15, 2009 4:23 PM)  Physical Exam  General:  Well developed, well nourished, in no acute distress. Neck:  Neck supple, no JVD. No masses, thyromegaly or abnormal cervical nodes. Lungs:  Clear bilaterally to auscultation and percussion. Heart:  Non-displaced PMI, chest non-tender; regular rate and rhythm, S1, S2 without rubs or gallops. 1/6 systolic murmur at the upper sternal border.  Carotid upstroke normal, no bruit. Pedals normal pulses. No edema, no varicosities. Abdomen:  Bowel sounds positive; abdomen soft and non-tender without masses, organomegaly, or hernias noted. No hepatosplenomegaly. Extremities:  No clubbing or cyanosis. Neurologic:  Alert and oriented x 3. Psych:  Normal affect.   Impression & Recommendations:  Problem # 1:  CHEST PAIN-UNSPECIFIED (ICD-786.50) Normal stress echo.  I suspect that the chest pain at night while lying in bed was due to GERD.  Symptoms have resolved with use of Protonix and a wedge to elevate the head of the bed.   Problem # 2:  MURMUR (ICD-785.2) No valvular abnormalities on echo.  I suspect that this is a benign flow murmur.    Problem # 3:  PALPITATIONS (ICD-785.1) Patient has frequent palpitations and irregularities in her heart rhythm.  I suspect that this represents PACs or PVCs.  She is worred about atrial fibrillation as it was just diagnosed in her mother.  I will set her up for a 48 hour holter monitor.   Other Orders: Holter Monitor (Holter Monitor)  Patient Instructions: 1)  Your physician has recommended that you wear a holter monitor.  Holter monitors are medical devices that record the heart's electrical activity. Doctors most often use these monitors to diagnose arrhythmias. Arrhythmias are problems with the speed or rhythm of the heartbeat. The monitor is a small, portable device. You can wear one while you do your normal daily activities. This is usually used to diagnose what is causing palpitations/syncope (passing out). 48 Hour monitor 2)  Your physician recommends that you schedule a follow-up appointment as needed with Dr Shirlee Latch.

## 2010-09-18 NOTE — Progress Notes (Signed)
Summary: monitor results from 11-01-09  Phone Note Outgoing Call   Call placed by: Katina Dung, RN, BSN,  November 06, 2009 9:24 AM Call placed to: Patient Summary of Call: results of holter monitor  Follow-up for Phone Call        discussed results of monitor from 11-01-09 with patient by telephone--Dr Shirlee Latch recommended consideration of Toprol XL 25mg  daily --pt prefers to make OV and discuss with Dr Rita Ohara  have given pt OV 11-11-09  with Dr Shirlee Latch to discuss holter results

## 2010-09-19 ENCOUNTER — Encounter (HOSPITAL_BASED_OUTPATIENT_CLINIC_OR_DEPARTMENT_OTHER): Payer: BC Managed Care – PPO | Admitting: Hematology and Oncology

## 2010-09-19 DIAGNOSIS — C20 Malignant neoplasm of rectum: Secondary | ICD-10-CM

## 2010-09-19 LAB — CBC WITH DIFFERENTIAL/PLATELET
BASO%: 0.2 % (ref 0.0–2.0)
LYMPH%: 26.8 % (ref 14.0–49.7)
MCHC: 34.4 g/dL (ref 31.5–36.0)
MONO#: 0.4 10*3/uL (ref 0.1–0.9)
NEUT#: 2.1 10*3/uL (ref 1.5–6.5)
Platelets: 282 10*3/uL (ref 145–400)
RBC: 3.62 10*6/uL — ABNORMAL LOW (ref 3.70–5.45)
RDW: 25.3 % — ABNORMAL HIGH (ref 11.2–14.5)
WBC: 3.5 10*3/uL — ABNORMAL LOW (ref 3.9–10.3)
lymph#: 0.9 10*3/uL (ref 0.9–3.3)

## 2010-09-19 LAB — COMPREHENSIVE METABOLIC PANEL
ALT: 17 U/L (ref 0–35)
CO2: 30 mEq/L (ref 19–32)
Calcium: 9.3 mg/dL (ref 8.4–10.5)
Chloride: 105 mEq/L (ref 96–112)
Sodium: 142 mEq/L (ref 135–145)
Total Protein: 7 g/dL (ref 6.0–8.3)

## 2010-10-16 ENCOUNTER — Encounter (HOSPITAL_BASED_OUTPATIENT_CLINIC_OR_DEPARTMENT_OTHER): Payer: BC Managed Care – PPO | Admitting: Hematology and Oncology

## 2010-10-16 ENCOUNTER — Other Ambulatory Visit: Payer: Self-pay | Admitting: Hematology and Oncology

## 2010-10-16 DIAGNOSIS — C20 Malignant neoplasm of rectum: Secondary | ICD-10-CM

## 2010-10-16 LAB — CBC WITH DIFFERENTIAL/PLATELET
BASO%: 0.4 % (ref 0.0–2.0)
EOS%: 5.7 % (ref 0.0–7.0)
HCT: 33.4 % — ABNORMAL LOW (ref 34.8–46.6)
LYMPH%: 21.8 % (ref 14.0–49.7)
MCH: 33.6 pg (ref 25.1–34.0)
MCHC: 34.8 g/dL (ref 31.5–36.0)
NEUT%: 61.4 % (ref 38.4–76.8)
Platelets: 280 10*3/uL (ref 145–400)
RBC: 3.47 10*6/uL — ABNORMAL LOW (ref 3.70–5.45)
lymph#: 0.8 10*3/uL — ABNORMAL LOW (ref 0.9–3.3)

## 2010-10-16 LAB — COMPREHENSIVE METABOLIC PANEL
ALT: 13 U/L (ref 0–35)
AST: 19 U/L (ref 0–37)
Creatinine, Ser: 0.79 mg/dL (ref 0.40–1.20)
Total Bilirubin: 0.8 mg/dL (ref 0.3–1.2)

## 2010-11-03 LAB — CBC
HCT: 37.2 % (ref 36.0–46.0)
MCHC: 33.7 g/dL (ref 30.0–36.0)
MCV: 89.6 fL (ref 78.0–100.0)
Platelets: 327 10*3/uL (ref 150–400)
RDW: 12.9 % (ref 11.5–15.5)

## 2010-11-03 LAB — GLUCOSE, CAPILLARY: Glucose-Capillary: 105 mg/dL — ABNORMAL HIGH (ref 70–99)

## 2010-11-04 ENCOUNTER — Other Ambulatory Visit: Payer: Self-pay | Admitting: Cardiovascular Disease

## 2010-11-04 DIAGNOSIS — R12 Heartburn: Secondary | ICD-10-CM

## 2010-11-06 LAB — URINE MICROSCOPIC-ADD ON

## 2010-11-06 LAB — CK TOTAL AND CKMB (NOT AT ARMC)
CK, MB: 1.1 ng/mL (ref 0.3–4.0)
Relative Index: INVALID (ref 0.0–2.5)
Total CK: 70 U/L (ref 7–177)

## 2010-11-06 LAB — CARDIAC PANEL(CRET KIN+CKTOT+MB+TROPI)
Relative Index: INVALID (ref 0.0–2.5)
Total CK: 59 U/L (ref 7–177)
Troponin I: 0.01 ng/mL (ref 0.00–0.06)
Troponin I: <0.01 ng/mL (ref 0.00–0.06)

## 2010-11-06 LAB — DIFFERENTIAL
Basophils Relative: 0 % (ref 0–1)
Lymphocytes Relative: 35 % (ref 12–46)
Lymphs Abs: 2.7 10*3/uL (ref 0.7–4.0)
Monocytes Absolute: 0.7 10*3/uL (ref 0.1–1.0)
Monocytes Relative: 9 % (ref 3–12)
Neutro Abs: 4.1 10*3/uL (ref 1.7–7.7)

## 2010-11-06 LAB — URINALYSIS, ROUTINE W REFLEX MICROSCOPIC
Glucose, UA: NEGATIVE mg/dL
Hgb urine dipstick: NEGATIVE
Specific Gravity, Urine: 1.018 (ref 1.005–1.030)
pH: 5.5 (ref 5.0–8.0)

## 2010-11-06 LAB — COMPREHENSIVE METABOLIC PANEL
Albumin: 4.2 g/dL (ref 3.5–5.2)
Alkaline Phosphatase: 60 U/L (ref 39–117)
BUN: 12 mg/dL (ref 6–23)
Calcium: 9.4 mg/dL (ref 8.4–10.5)
Potassium: 3.6 mEq/L (ref 3.5–5.1)
Sodium: 139 mEq/L (ref 135–145)
Total Protein: 7 g/dL (ref 6.0–8.3)

## 2010-11-06 LAB — CBC
Hemoglobin: 13.5 g/dL (ref 12.0–15.0)
RBC: 4.34 MIL/uL (ref 3.87–5.11)
RDW: 12.9 % (ref 11.5–15.5)

## 2010-11-06 LAB — D-DIMER, QUANTITATIVE: D-Dimer, Quant: 0.65 ug/mL-FEU — ABNORMAL HIGH (ref 0.00–0.48)

## 2010-11-12 ENCOUNTER — Telehealth: Payer: Self-pay | Admitting: Cardiology

## 2010-11-13 ENCOUNTER — Other Ambulatory Visit: Payer: Self-pay | Admitting: *Deleted

## 2010-11-13 MED ORDER — PANTOPRAZOLE SODIUM 40 MG PO TBEC: 40.0000 mg | DELAYED_RELEASE_TABLET | Freq: Every day | ORAL | Status: DC

## 2010-11-14 ENCOUNTER — Other Ambulatory Visit: Payer: Self-pay | Admitting: *Deleted

## 2010-12-02 ENCOUNTER — Encounter (HOSPITAL_BASED_OUTPATIENT_CLINIC_OR_DEPARTMENT_OTHER): Payer: BC Managed Care – PPO | Admitting: Hematology and Oncology

## 2010-12-02 ENCOUNTER — Other Ambulatory Visit: Payer: Self-pay | Admitting: Hematology and Oncology

## 2010-12-02 ENCOUNTER — Ambulatory Visit (HOSPITAL_COMMUNITY)
Admission: RE | Admit: 2010-12-02 | Discharge: 2010-12-02 | Disposition: A | Discharge status: Home / Self Care | Payer: BC Managed Care – PPO | Source: Ambulatory Visit | Attending: Hematology and Oncology | Admitting: Hematology and Oncology

## 2010-12-02 DIAGNOSIS — C20 Malignant neoplasm of rectum: Secondary | ICD-10-CM | POA: Insufficient documentation

## 2010-12-02 DIAGNOSIS — Z933 Colostomy status: Secondary | ICD-10-CM | POA: Insufficient documentation

## 2010-12-02 DIAGNOSIS — R197 Diarrhea, unspecified: Secondary | ICD-10-CM | POA: Insufficient documentation

## 2010-12-02 DIAGNOSIS — K6289 Other specified diseases of anus and rectum: Secondary | ICD-10-CM | POA: Insufficient documentation

## 2010-12-02 DIAGNOSIS — N949 Unspecified condition associated with female genital organs and menstrual cycle: Secondary | ICD-10-CM | POA: Insufficient documentation

## 2010-12-02 LAB — CBC WITH DIFFERENTIAL/PLATELET
BASO%: 0.4 % (ref 0.0–2.0)
Basophils Absolute: 0 10*3/uL (ref 0.0–0.1)
EOS%: 2.1 % (ref 0.0–7.0)
Eosinophils Absolute: 0.1 10*3/uL (ref 0.0–0.5)
HCT: 35.1 % (ref 34.8–46.6)
HGB: 12.3 g/dL (ref 11.6–15.9)
LYMPH%: 24.9 % (ref 14.0–49.7)
MCH: 33.8 pg (ref 25.1–34.0)
MCHC: 35.1 g/dL (ref 31.5–36.0)
MCV: 96.4 fL (ref 79.5–101.0)
MONO#: 0.3 10*3/uL (ref 0.1–0.9)
MONO%: 10.5 % (ref 0.0–14.0)
NEUT#: 1.9 10*3/uL (ref 1.5–6.5)
NEUT%: 62.1 % (ref 38.4–76.8)
Platelets: 220 10*3/uL (ref 145–400)
RBC: 3.65 10*6/uL — ABNORMAL LOW (ref 3.70–5.45)
RDW: 15.4 % — ABNORMAL HIGH (ref 11.2–14.5)
WBC: 3.1 10*3/uL — ABNORMAL LOW (ref 3.9–10.3)
lymph#: 0.8 10*3/uL — ABNORMAL LOW (ref 0.9–3.3)

## 2010-12-02 LAB — CMP (CANCER CENTER ONLY)
ALT(SGPT): 23 U/L (ref 10–47)
BUN, Bld: 12 mg/dL (ref 7–22)
CO2: 26 mEq/L (ref 18–33)
Calcium: 9 mg/dL (ref 8.0–10.3)
Chloride: 103 mEq/L (ref 98–108)
Creat: 0.8 mg/dl (ref 0.6–1.2)
Total Bilirubin: 1.2 mg/dl (ref 0.20–1.60)

## 2010-12-02 LAB — LACTATE DEHYDROGENASE: LDH: 145 U/L (ref 94–250)

## 2010-12-02 MED ORDER — IOHEXOL 300 MG/ML  SOLN
100.0000 mL | Freq: Once | INTRAMUSCULAR | Status: AC | PRN
  Administered 2010-12-02: 100 mL via INTRAVENOUS

## 2010-12-04 ENCOUNTER — Encounter (HOSPITAL_BASED_OUTPATIENT_CLINIC_OR_DEPARTMENT_OTHER): Payer: BC Managed Care – PPO | Admitting: Hematology and Oncology

## 2010-12-04 ENCOUNTER — Other Ambulatory Visit: Payer: Self-pay | Admitting: Hematology and Oncology

## 2010-12-04 DIAGNOSIS — C20 Malignant neoplasm of rectum: Secondary | ICD-10-CM

## 2010-12-04 DIAGNOSIS — Z933 Colostomy status: Secondary | ICD-10-CM

## 2010-12-04 DIAGNOSIS — K6289 Other specified diseases of anus and rectum: Secondary | ICD-10-CM

## 2010-12-19 ENCOUNTER — Encounter: Payer: Self-pay | Admitting: Cardiology

## 2010-12-23 ENCOUNTER — Ambulatory Visit (INDEPENDENT_AMBULATORY_CARE_PROVIDER_SITE_OTHER): Payer: BC Managed Care – PPO | Admitting: Cardiology

## 2010-12-23 ENCOUNTER — Encounter: Payer: Self-pay | Admitting: Cardiology

## 2010-12-23 VITALS — BP 111/72 | HR 75 | Ht 66.0 in | Wt 161.0 lb

## 2010-12-23 DIAGNOSIS — I4892 Unspecified atrial flutter: Secondary | ICD-10-CM

## 2010-12-23 DIAGNOSIS — R002 Palpitations: Secondary | ICD-10-CM

## 2010-12-23 NOTE — Patient Instructions (Signed)
Your physician wants you to follow-up in: 1 year with Dr McLean. (May 2013). You will receive a reminder letter in the mail two months in advance. If you don't receive a letter, please call our office to schedule the follow-up appointment.  

## 2010-12-24 ENCOUNTER — Telehealth: Payer: Self-pay | Admitting: Cardiology

## 2010-12-24 NOTE — Telephone Encounter (Signed)
Will forward to Dr McLean for review. 

## 2010-12-24 NOTE — Assessment & Plan Note (Signed)
Patient was found to have a run of SVT (possible atrial flutter) at a rate of 150 bpm on holter monitor in 2011.  Her CHADSVASC score is 0.  I did a 3 week event monitor to assess for arrhythmia burden after this and no SVT, atrial fib, or atrial flutter was noted.  She is on Toprol XL and is noted few palpitations.  Continue ASA.

## 2010-12-24 NOTE — Telephone Encounter (Signed)
May hold aspirin for surgery. Resume afterwards.

## 2010-12-24 NOTE — Telephone Encounter (Signed)
Pt having colon surgery on Jan 01, 2011. Dr office calling re pt aspirin when pt can be off the meds. Dr fax# [phone]

## 2010-12-24 NOTE — Progress Notes (Signed)
PCP: Dr. Zachery Dauer  55 yo presented for evaluation of palpitations/atrial arrhythmias. She was noted on 48 hour holter monitor done for palpitations to have an episode of SVT (possible atrial flutter) at a rate of 150 bpm.  I started her on low dose Toprol XL (25 mg daily) and her palpitations decreased considerably.  She did a 3 week event monitor on Toprol XL that showed no atrial fibrillation, atrial flutter, or other SVT. Since that time, she has had only occasional mild palpitations.  No long runs of tachypalpitations.  She was diagnosed last year with rectal cancer and has undergone chemotherapy and radiation followed by surgery in 9/11 at [facility].   ECG: NSR, normal  Labs (4/11): lipomed profile showed LDL 117, HDL 41, LDL size 21, small LDL-P 355.  This is not a high risk profile.   Allergies (verified):  No Known Drug Allergies  Past Medical History: 1. GERD 2. Atypical chest pain: Stress echo (2/11) showed EF 60%, normal valves and normal RV.  There was no evidence for ischemia with stress: all walls augmented appropriately.    3. Palpitations: Holter monitor (3/11) showed 1 run of SVT (possible atrial fiutter) at a rate of 150 bpm.  Event monitor (4/11) for 3 wks showed no atrial fib, atrial flutter, or other SVT.  4. DVT several years ago when on HRT.  Took coumadin for a few months.   Family History: Father was found to have CAD in his early 23s and had CABG at 45.  Grandfather had CABG at 49.  2 brothers are healthy.  Mother with atrial fibrillation.   Social History: Married, not working Tobacco Use - No.  Alcohol Use - no  Current Outpatient Prescriptions  Medication Sig Dispense Refill  . aspirin 325 MG tablet Take 325 mg by mouth daily.        . fish oil-omega-3 fatty acids 1000 MG capsule Take 2 g by mouth daily.        . metoprolol succinate (TOPROL-XL) 25 MG 24 hr tablet Take 1/2 tab daily (12.5 mg)      . pantoprazole (PROTONIX) 40 MG tablet TAKE 1 TABLET EVERY DAY  30  tablet  5  . Probiotic Product (ALIGN PO) Take by mouth. 1 tab daily       . pyridOXINE (VITAMIN B-6) 50 MG tablet Take 50 mg twice a day         BP 111/72  Pulse 75  Ht 5\' 6"  (1.676 m)  Wt 161 lb (73.029 kg)  BMI 25.99 kg/m2 General: NAD Neck: No JVD, no thyromegaly or thyroid nodule.  Lungs: Clear to auscultation bilaterally with normal respiratory effort. CV: Nondisplaced PMI.  Heart regular S1/S2, no S3/S4, no murmur.  No peripheral edema.  No carotid bruit.  Normal pedal pulses.  Abdomen: Soft, nontender, no hepatosplenomegaly, no distention.  Neurologic: Alert and oriented x 3.  Psych: Normal affect. Extremities: No clubbing or cyanosis.

## 2010-12-25 NOTE — Telephone Encounter (Signed)
Dr [facility] office called to find out response from Dr Shirlee Latch.  I made them aware that the pt could hold ASA and this note was faxed to Dr [facility] office.

## 2010-12-26 ENCOUNTER — Ambulatory Visit: Payer: BC Managed Care – PPO | Attending: Radiation Oncology | Admitting: Radiation Oncology

## 2011-01-13 ENCOUNTER — Encounter: Payer: BC Managed Care – PPO | Admitting: Hematology and Oncology

## 2011-02-24 ENCOUNTER — Encounter (HOSPITAL_BASED_OUTPATIENT_CLINIC_OR_DEPARTMENT_OTHER): Payer: BC Managed Care – PPO | Admitting: Hematology and Oncology

## 2011-02-24 DIAGNOSIS — C20 Malignant neoplasm of rectum: Secondary | ICD-10-CM

## 2011-02-24 DIAGNOSIS — Z933 Colostomy status: Secondary | ICD-10-CM

## 2011-03-14 ENCOUNTER — Other Ambulatory Visit: Payer: Self-pay | Admitting: Cardiology

## 2011-03-18 NOTE — Telephone Encounter (Signed)
Per pt call needing RX refill of metoprolol 25 mg called into [facility] in [place]. Pt said pharmacy has sent 2 or 3 faxes over for refill requests pt said she has 2 more pills left. Please call RX refill in. Pt also wants to know if she can get refill called in for a one year supply. Please return cal to pt to inform refill has been called in and if that refill has been called in for one years supply or six months supply.

## 2011-04-06 ENCOUNTER — Other Ambulatory Visit (HOSPITAL_COMMUNITY): Payer: BC Managed Care – PPO

## 2011-04-21 ENCOUNTER — Encounter (HOSPITAL_BASED_OUTPATIENT_CLINIC_OR_DEPARTMENT_OTHER): Payer: BC Managed Care – PPO | Admitting: Hematology and Oncology

## 2011-04-21 ENCOUNTER — Encounter (HOSPITAL_COMMUNITY): Payer: Self-pay

## 2011-04-21 ENCOUNTER — Ambulatory Visit (HOSPITAL_COMMUNITY)
Admission: RE | Admit: 2011-04-21 | Discharge: 2011-04-21 | Disposition: A | Discharge status: Home / Self Care | Payer: BC Managed Care – PPO | Source: Ambulatory Visit | Attending: Hematology and Oncology | Admitting: Hematology and Oncology

## 2011-04-21 ENCOUNTER — Other Ambulatory Visit: Payer: Self-pay | Admitting: Hematology and Oncology

## 2011-04-21 DIAGNOSIS — Z933 Colostomy status: Secondary | ICD-10-CM | POA: Insufficient documentation

## 2011-04-21 DIAGNOSIS — C20 Malignant neoplasm of rectum: Secondary | ICD-10-CM

## 2011-04-21 DIAGNOSIS — K6289 Other specified diseases of anus and rectum: Secondary | ICD-10-CM | POA: Insufficient documentation

## 2011-04-21 LAB — CBC WITH DIFFERENTIAL/PLATELET
Basophils Absolute: 0 10*3/uL (ref 0.0–0.1)
Eosinophils Absolute: 0.1 10*3/uL (ref 0.0–0.5)
HGB: 12.1 g/dL (ref 11.6–15.9)
MONO#: 0.3 10*3/uL (ref 0.1–0.9)
MONO%: 8.4 % (ref 0.0–14.0)
NEUT#: 2.7 10*3/uL (ref 1.5–6.5)
RBC: 3.88 10*6/uL (ref 3.70–5.45)
RDW: 13.3 % (ref 11.2–14.5)
WBC: 3.9 10*3/uL (ref 3.9–10.3)
lymph#: 0.8 10*3/uL — ABNORMAL LOW (ref 0.9–3.3)

## 2011-04-21 LAB — CEA: CEA: <0.5 ng/mL (ref 0.0–5.0)

## 2011-04-21 LAB — CMP (CANCER CENTER ONLY)
Albumin: 3.7 g/dL (ref 3.3–5.5)
Alkaline Phosphatase: 57 U/L (ref 26–84)
BUN, Bld: 13 mg/dL (ref 7–22)
CO2: 26 mEq/L (ref 18–33)
Calcium: 9 mg/dL (ref 8.0–10.3)
Chloride: 102 mEq/L (ref 98–108)
Glucose, Bld: 88 mg/dL (ref 73–118)
Potassium: 4.4 mEq/L (ref 3.3–4.7)
Sodium: 143 mEq/L (ref 128–145)
Total Protein: 6.8 g/dL (ref 6.4–8.1)

## 2011-04-21 MED ORDER — IOHEXOL 300 MG/ML  SOLN
100.0000 mL | Freq: Once | INTRAMUSCULAR | Status: AC | PRN
  Administered 2011-04-21: 100 mL via INTRAVENOUS

## 2011-04-24 ENCOUNTER — Encounter (HOSPITAL_BASED_OUTPATIENT_CLINIC_OR_DEPARTMENT_OTHER): Payer: BC Managed Care – PPO | Admitting: Hematology and Oncology

## 2011-04-24 ENCOUNTER — Other Ambulatory Visit: Payer: Self-pay | Admitting: Hematology and Oncology

## 2011-04-24 DIAGNOSIS — Z933 Colostomy status: Secondary | ICD-10-CM

## 2011-04-24 DIAGNOSIS — C20 Malignant neoplasm of rectum: Secondary | ICD-10-CM

## 2011-05-11 ENCOUNTER — Other Ambulatory Visit: Payer: Self-pay | Admitting: Cardiology

## 2011-06-09 ENCOUNTER — Other Ambulatory Visit: Payer: Self-pay | Admitting: Cardiology

## 2011-06-15 ENCOUNTER — Other Ambulatory Visit: Payer: Self-pay | Admitting: Obstetrics & Gynecology

## 2011-06-15 DIAGNOSIS — Z1231 Encounter for screening mammogram for malignant neoplasm of breast: Secondary | ICD-10-CM

## 2011-06-16 ENCOUNTER — Encounter (HOSPITAL_BASED_OUTPATIENT_CLINIC_OR_DEPARTMENT_OTHER): Payer: BC Managed Care – PPO | Admitting: Hematology and Oncology

## 2011-06-16 DIAGNOSIS — C20 Malignant neoplasm of rectum: Secondary | ICD-10-CM

## 2011-06-16 DIAGNOSIS — Z933 Colostomy status: Secondary | ICD-10-CM

## 2011-07-14 ENCOUNTER — Ambulatory Visit
Admission: RE | Admit: 2011-07-14 | Discharge: 2011-07-14 | Disposition: A | Discharge status: Home / Self Care | Payer: BC Managed Care – PPO | Source: Ambulatory Visit | Attending: Obstetrics & Gynecology | Admitting: Obstetrics & Gynecology

## 2011-07-14 DIAGNOSIS — Z1231 Encounter for screening mammogram for malignant neoplasm of breast: Secondary | ICD-10-CM

## 2011-07-16 ENCOUNTER — Encounter: Payer: Self-pay | Admitting: Radiation Oncology

## 2011-07-17 ENCOUNTER — Encounter: Payer: Self-pay | Admitting: Radiation Oncology

## 2011-07-17 ENCOUNTER — Ambulatory Visit
Admission: RE | Admit: 2011-07-17 | Discharge: 2011-07-17 | Disposition: A | Discharge status: Home / Self Care | Payer: BC Managed Care – PPO | Source: Ambulatory Visit | Attending: Radiation Oncology | Admitting: Radiation Oncology

## 2011-07-17 DIAGNOSIS — C20 Malignant neoplasm of rectum: Secondary | ICD-10-CM

## 2011-07-17 NOTE — Progress Notes (Signed)
CC:   Laurice Record, M.D. Willis Modena, MD W. Varney Baas, M.D.  DIAGNOSIS:  Rectal cancer.  NARRATIVE:  Ms. Garry returns to the clinic today for followup.  She states that she has been fairly stable over the months since she was last seen.  She did see urology and is doing some exercises for some urinary incontinence, and she feels that this is helping.  She was scoped, and some radiation effect was noted as the likely cause of her urinary issues.  This certainly make sense given her plan.  The patient notes some continued vaginal tightness and has been using a vaginal dilator, which was a medium size.  However, she felt that this was not helping as much as it could, and therefore, we discussed possibly trying a large size if she is able to tolerate this.  She was cautioned to follow this if she does not routinely use a dilator, and this would depend on her level of sexual activity to prevent further worsening. The patient did have a CT scan of the pelvis on 04/21/2011.  This showed some treatment effects in the presacral space which were similar.  This consisted of some presacral fascial thickening.  There was no abnormal enhancement or well-defined mass to suggest locally recurrent disease. No pelvic adenopathy present.  PHYSICAL EXAM:  Vital Signs:  Weight 171 pounds.  Blood pressure 127/81, pulse 83, respiratory rate 22.  General:  A well-developed female in no acute distress, alert and oriented x3.  Cardiovascular:  Regular rate and rhythm.  Respiratory:  Clear to auscultation bilaterally.  GI. Abdomen is soft, nontender.  Normal bowel sounds.  Extremities:  No edema present.  IMPRESSION AND PLAN:  Ms. Flight appears to be clinically stable at this point, and her last scans looked good with no evidence of recurrence or metastatic disease within the pelvis.  She is scheduled for repeat imaging with followup with Dr. Dalene Carrow.  We discussed her using a larger vaginal  dilator to see if this helped decrease some of the discomfort with sexual intercourse, and we have given this to her today.  We discussed possible followup options, and we decided to have her return to our clinic on a p.r.n. basis given her other followup, which has been scheduled.  The patient does know to contact us if we can be of any further assistance.  TIME SPENT:  I spent 20 minutes with Ms. Byard today, the majority of which was spent counseling her on her diagnosis and coordinating her care.    ______________________________ Radene Gunning, M.D., Ph.D. JSM/MEDQ  D:  07/17/2011  T:  07/17/2011  Job:  [id number]

## 2011-07-17 NOTE — Progress Notes (Signed)
Patient presents to the clinic today unaccompanied for follow up appointment with Dr. Mitzi Hansen. No distress noted. Steady gait noted. Pleasant affect noted. Patient reports urinary incontinence has improved. Patient reports that vaginal tightness is no better or worse despite using dilator. Patient reports "manageable discomfort with intercourse." Patient denies hematuria. Patient denies nausea, vomiting, diarrhea, or constipation.

## 2011-07-23 ENCOUNTER — Telehealth: Payer: Self-pay | Admitting: *Deleted

## 2011-07-23 ENCOUNTER — Other Ambulatory Visit: Payer: Self-pay | Admitting: *Deleted

## 2011-07-23 NOTE — Telephone Encounter (Signed)
Pt called and would like to speak with md  About symptoms that pt has.    Pt stated she has achiness at rectal area ;  Feeling cramping  -  Just uncomfortable for about 3 weeks now.   Pt denied fever ;  Stated  bowel and bladder functioning fine.  When pt has diarrhea,  Pt felt the cramping at rectal area.   Pt was concerned and would like to speak to md about her situation. Pt's    Phone      [phone].

## 2011-07-23 NOTE — Telephone Encounter (Signed)
Spoke with pt on cell phone and instructed pt to call the surgeon Dr. Epifania Gore  For above symptoms  As per Dr. Lonell Face  Instructions.    Pt voiced understanding.

## 2011-08-03 ENCOUNTER — Ambulatory Visit (HOSPITAL_BASED_OUTPATIENT_CLINIC_OR_DEPARTMENT_OTHER): Payer: BC Managed Care – PPO

## 2011-08-03 DIAGNOSIS — C20 Malignant neoplasm of rectum: Secondary | ICD-10-CM

## 2011-08-03 DIAGNOSIS — Z452 Encounter for adjustment and management of vascular access device: Secondary | ICD-10-CM

## 2011-08-03 MED ORDER — HEPARIN SOD (PORK) LOCK FLUSH 100 UNIT/ML IV SOLN
500.0000 [IU] | Freq: Once | INTRAVENOUS | Status: AC
  Administered 2011-08-03: 500 [IU] via INTRAVENOUS
  Filled 2011-08-03: qty 5

## 2011-08-03 MED ORDER — SODIUM CHLORIDE 0.9 % IJ SOLN
10.0000 mL | INTRAMUSCULAR | Status: DC | PRN
  Administered 2011-08-03: 10 mL via INTRAVENOUS
  Filled 2011-08-03: qty 10

## 2011-08-26 ENCOUNTER — Telehealth: Payer: Self-pay | Admitting: Hematology and Oncology

## 2011-08-26 NOTE — Telephone Encounter (Signed)
Called pt, left message , appt for 09/21/11 lab and CT, Md visit on 09/23/11, pt requested to be NPO after midnight . Pt also has to pick up oral before CT scan

## 2011-09-21 ENCOUNTER — Other Ambulatory Visit (HOSPITAL_BASED_OUTPATIENT_CLINIC_OR_DEPARTMENT_OTHER): Payer: BC Managed Care – PPO | Admitting: Lab

## 2011-09-21 ENCOUNTER — Ambulatory Visit (HOSPITAL_COMMUNITY)
Admission: RE | Admit: 2011-09-21 | Discharge: 2011-09-21 | Disposition: A | Discharge status: Home / Self Care | Payer: BC Managed Care – PPO | Source: Ambulatory Visit | Attending: Hematology and Oncology | Admitting: Hematology and Oncology

## 2011-09-21 DIAGNOSIS — IMO0002 Reserved for concepts with insufficient information to code with codable children: Secondary | ICD-10-CM | POA: Insufficient documentation

## 2011-09-21 DIAGNOSIS — C20 Malignant neoplasm of rectum: Secondary | ICD-10-CM

## 2011-09-21 LAB — CBC WITH DIFFERENTIAL/PLATELET
BASO%: 0.4 % (ref 0.0–2.0)
LYMPH%: 21.9 % (ref 14.0–49.7)
MCHC: 34.3 g/dL (ref 31.5–36.0)
MONO#: 0.4 10*3/uL (ref 0.1–0.9)
NEUT#: 3.1 10*3/uL (ref 1.5–6.5)
Platelets: 298 10*3/uL (ref 145–400)
RBC: 4.33 10*6/uL (ref 3.70–5.45)
RDW: 12.8 % (ref 11.2–14.5)
WBC: 4.7 10*3/uL (ref 3.9–10.3)
lymph#: 1 10*3/uL (ref 0.9–3.3)

## 2011-09-21 LAB — CMP (CANCER CENTER ONLY)
ALT(SGPT): 19 U/L (ref 10–47)
Albumin: 3.8 g/dL (ref 3.3–5.5)
CO2: 29 mEq/L (ref 18–33)
Potassium: 4.1 mEq/L (ref 3.3–4.7)
Sodium: 142 mEq/L (ref 128–145)
Total Bilirubin: 0.8 mg/dl (ref 0.20–1.60)
Total Protein: 7.7 g/dL (ref 6.4–8.1)

## 2011-09-21 LAB — CEA: CEA: <0.5 ng/mL (ref 0.0–5.0)

## 2011-09-21 MED ORDER — IOHEXOL 300 MG/ML  SOLN
100.0000 mL | Freq: Once | INTRAMUSCULAR | Status: AC | PRN
  Administered 2011-09-21: 100 mL via INTRAVENOUS

## 2011-09-23 ENCOUNTER — Other Ambulatory Visit: Payer: Self-pay | Admitting: Hematology and Oncology

## 2011-09-23 ENCOUNTER — Ambulatory Visit (HOSPITAL_BASED_OUTPATIENT_CLINIC_OR_DEPARTMENT_OTHER): Payer: BC Managed Care – PPO | Admitting: Hematology and Oncology

## 2011-09-23 ENCOUNTER — Telehealth: Payer: Self-pay | Admitting: Hematology and Oncology

## 2011-09-23 VITALS — BP 107/78 | HR 86 | Temp 98.1°F | Ht 66.0 in | Wt 171.4 lb

## 2011-09-23 DIAGNOSIS — C20 Malignant neoplasm of rectum: Secondary | ICD-10-CM

## 2011-09-23 MED ORDER — HEPARIN SOD (PORK) LOCK FLUSH 100 UNIT/ML IV SOLN
500.0000 [IU] | Freq: Once | INTRAVENOUS | Status: AC
  Administered 2011-09-23: 500 [IU] via INTRAVENOUS
  Filled 2011-09-23: qty 5

## 2011-09-23 MED ORDER — SODIUM CHLORIDE 0.9 % IJ SOLN
10.0000 mL | INTRAMUSCULAR | Status: DC | PRN
  Administered 2011-09-23: 10 mL via INTRAVENOUS
  Filled 2011-09-23: qty 10

## 2011-09-23 NOTE — Progress Notes (Signed)
This office note has been dictated.

## 2011-09-23 NOTE — Telephone Encounter (Signed)
Gv pt appt for july2013.  informed Md that she has to place pts port removal request under orders to scheduled appt

## 2011-09-23 NOTE — Progress Notes (Signed)
CC:   Griffith Citron, M.D. San Morelle, M.D.  IDENTIFYING STATEMENT:  The patient is a 56 year old woman with rectal cancer who presents for followup.  INTERVAL HISTORY:  Mrs. Sandles reports stress incontinence.  She is receiving physical therapy and performing rectal-pelvic floor exercises. Has no specific GI complaints.  Her weight is stable.  Denies rectal discharge.  Bowels are moving without difficulty.  Colostomy is functioning well.  Her weight is stable.  She denies pain.  CT scan of the chest, abdomen, and pelvis on 09/21/2011 showed no evidence of disease within the chest, abdomen, or pelvis.  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  MEDICATIONS:  Reviewed and updated.  ALLERGIES:  None.  PAST MEDICAL HISTORY:  Unchanged.  FAMILY HISTORY:  Unchanged.  SOCIAL HISTORY:  Unchanged.  REVIEW OF SYSTEMS:  Ten-point review of systems negative.  PHYSICAL EXAM:  General:  The patient is a well-appearing, well- nourished woman in no distress.  Vitals:  Pulse 86, blood pressure 107/78, temperature 98.1, respirations 20, weight 161 pounds.  HEENT: Head is atraumatic, normocephalic.  Sclerae anicteric.  Mouth moist. Chest:  Clear.  Port:  No sign of infection.  Abdomen:  Soft, nontender. Bowel sounds present.  Colostomy bag filled with semi-formed stool. Extremities:  No calf tenderness.  Pulses present and symmetrical. Lymph Nodes:  No adenopathy.  CNS:  Nonfocal.  LAB DATA:  09/21/2011 white cell count 4.7, hemoglobin 13.3, hematocrit 38.8, platelets 298.  Sodium 142, potassium 4.1, chloride 102, CO2 29, BUN 17, creatinine 0.9, glucose 99.  T bili 0.8, alkaline phosphatase 62, AST 20, ALT 19.  Results of CTs as in interval history.  IMPRESSION AND PLAN:  Mrs. 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  She is status post neoadjuvant 5- FU with radiation from 01/27/2010 to 03/16/2010 receiving a total of 50.4 Gy with continuous infusion 5-FU.  Tumor was downgraded to T1 with pathology revealing lakes of mucin with sparse glandular epithelium in the submucosa with no evidence of LV invasion.  None of the 10 lymph nodes sampled had evidence of malignancy.  She began Xeloda in the adjuvant setting from July 02, 2010, to October 18, 2010.  She received a total of 6 cycles.  Mrs. Guttman's current scan indicates no evidence of recurrence.  She will follow up in 4 months' time with lab work.  She will have her port removed by Interventional Radiology.    ______________________________ Laurice Record, M.D. LIO/MEDQ  D:  09/23/2011  T:  09/23/2011  Job:  [id number]

## 2011-09-25 ENCOUNTER — Other Ambulatory Visit: Payer: Self-pay | Admitting: Radiology

## 2011-09-29 ENCOUNTER — Encounter (HOSPITAL_COMMUNITY): Payer: Self-pay | Admitting: Pharmacy Technician

## 2011-10-02 ENCOUNTER — Ambulatory Visit (HOSPITAL_COMMUNITY)
Admission: RE | Admit: 2011-10-02 | Discharge: 2011-10-02 | Disposition: A | Discharge status: Home / Self Care | Payer: BC Managed Care – PPO | Source: Ambulatory Visit | Attending: Hematology and Oncology | Admitting: Hematology and Oncology

## 2011-10-02 VITALS — BP 112/68 | HR 71 | Temp 98.4°F | Resp 16

## 2011-10-02 DIAGNOSIS — K219 Gastro-esophageal reflux disease without esophagitis: Secondary | ICD-10-CM | POA: Insufficient documentation

## 2011-10-02 DIAGNOSIS — Z79899 Other long term (current) drug therapy: Secondary | ICD-10-CM | POA: Insufficient documentation

## 2011-10-02 DIAGNOSIS — Z452 Encounter for adjustment and management of vascular access device: Secondary | ICD-10-CM | POA: Insufficient documentation

## 2011-10-02 DIAGNOSIS — Z86718 Personal history of other venous thrombosis and embolism: Secondary | ICD-10-CM | POA: Insufficient documentation

## 2011-10-02 DIAGNOSIS — C20 Malignant neoplasm of rectum: Secondary | ICD-10-CM

## 2011-10-02 MED ORDER — FENTANYL CITRATE 0.05 MG/ML IJ SOLN
INTRAMUSCULAR | Status: AC
  Filled 2011-10-02: qty 4

## 2011-10-02 MED ORDER — CEFAZOLIN SODIUM 1-5 GM-% IV SOLN
1.0000 g | Freq: Once | INTRAVENOUS | Status: AC
  Administered 2011-10-02: 1 g via INTRAVENOUS

## 2011-10-02 MED ORDER — CEFAZOLIN SODIUM 1-5 GM-% IV SOLN
INTRAVENOUS | Status: AC
  Filled 2011-10-02: qty 50

## 2011-10-02 MED ORDER — FENTANYL CITRATE 0.05 MG/ML IJ SOLN
INTRAMUSCULAR | Status: AC | PRN
  Administered 2011-10-02: 100 ug via INTRAVENOUS

## 2011-10-02 MED ORDER — SODIUM CHLORIDE 0.9 % IV SOLN: Freq: Once | INTRAVENOUS | Status: DC

## 2011-10-02 MED ORDER — LIDOCAINE HCL 1 % IJ SOLN
INTRAMUSCULAR | Status: AC
  Filled 2011-10-02: qty 20

## 2011-10-02 NOTE — Procedures (Signed)
Procedure:  Removal of portacath Findings:  Right chest port removed in entirety with blunt and sharp dissection.  Pocket irrigated.  Wound closure with subcutaneous 3-0 Monocryl and subcuticular 4-0 Vicryl. No complications.

## 2011-10-02 NOTE — H&P (Signed)
Agree 

## 2011-10-02 NOTE — H&P (Signed)
Colleen Kirby is an 56 y.o. female.   Chief Complaint: " I'm here to have my port removed" HPI: Patient with history of rectal adenocarcinoma who has completed therapy presents today for port a cath removal.  Past Medical History  Diagnosis Date  . GERD (gastroesophageal reflux disease)   . Atypical chest pain   . Palpitations   . History of DVT (deep vein thrombosis)   . Cancer     rectal ca    No past surgical history on file.  Family History  Problem Relation Age of Onset  . Coronary artery disease    . Arrhythmia     Social History:  reports that she has never smoked. She does not have any smokeless tobacco history on file. She reports that she does not drink alcohol. Her drug history not on file.  Allergies:  Allergies  Allergen Reactions  . Adhesive (Tape) Itching and Rash    Medications Prior to Admission  Medication Sig Dispense Refill  . albuterol (PROVENTIL HFA;VENTOLIN HFA) 108 (90 BASE) MCG/ACT inhaler Inhale 2 puffs into the lungs every 6 (six) hours as needed. Wheezing      . aspirin 325 MG tablet Take 325 mg by mouth daily before breakfast.       . cholecalciferol (VITAMIN D) 1000 UNITS tablet Take 1,000 Units by mouth daily.        . fish oil-omega-3 fatty acids 1000 MG capsule Take 1 g by mouth daily.       Marland Kitchen ibuprofen (ADVIL,MOTRIN) 200 MG tablet Take 200-400 mg by mouth every 6 (six) hours as needed. Pain      . metoprolol succinate (TOPROL-XL) 25 MG 24 hr tablet Take 12.5 mg by mouth at bedtime.       . pantoprazole (PROTONIX) 40 MG tablet TAKE 1 TABLET EVERY DAY  30 tablet  5  . Probiotic Product (ALIGN PO) Take by mouth. 1 tab daily       . pyridOXINE (VITAMIN B-6) 50 MG tablet Take 50 mg twice a day        Medications Prior to Admission  Medication Dose Route Frequency Provider Last Rate Last Dose  . 0.9 %  sodium chloride infusion   Intravenous Once Robet Leu, PA      . ceFAZolin (ANCEF) IVPB 1 g/50 mL premix  1 g Intravenous Once Robet Leu, PA       Results for orders placed in visit on 09/21/11  CBC WITH DIFFERENTIAL      Component Value Range   WBC 4.7  3.9 - 10.3 (10e3/uL)   NEUT# 3.1  1.5 - 6.5 (10e3/uL)   HGB 13.3  11.6 - 15.9 (g/dL)   HCT 16.1  09.6 - 04.5 (%)   Platelets 298  145 - 400 (10e3/uL)   MCV 89.5  79.5 - 101.0 (fL)   MCH 30.7  25.1 - 34.0 (pg)   MCHC 34.3  31.5 - 36.0 (g/dL)   RBC 4.09  8.11 - 9.14 (10e6/uL)   RDW 12.8  11.2 - 14.5 (%)   lymph# 1.0  0.9 - 3.3 (10e3/uL)   MONO# 0.4  0.1 - 0.9 (10e3/uL)   Eosinophils Absolute 0.1  0.0 - 0.5 (10e3/uL)   Basophils Absolute 0.0  0.0 - 0.1 (10e3/uL)   NEUT% 66.5  38.4 - 76.8 (%)   LYMPH% 21.9  14.0 - 49.7 (%)   MONO% 9.2  0.0 - 14.0 (%)   EOS% 2.0  0.0 - 7.0 (%)  BASO% 0.4  0.0 - 2.0 (%)  COMPREHENSIVE METABOLIC PANEL      Component Value Range   Sodium 142  128 - 145 (mEq/L)   Potassium 4.1  3.3 - 4.7 (mEq/L)   Chloride 102  98 - 108 (mEq/L)   CO2 29  18 - 33 (mEq/L)   Glucose, Bld 99  73 - 118 (mg/dL)   BUN, Bld 17  7 - 22 (mg/dL)   Creat 0.9  0.6 - 1.2 (mg/dl)   Total Bilirubin 1.61  0.20 - 1.60 (mg/dl)   Alkaline Phosphatase 62  26 - 84 (U/L)   AST 20  11 - 38 (U/L)   ALT(SGPT) 19  10 - 47 (U/L)   Total Protein 7.7  6.4 - 8.1 (g/dL)   Albumin 3.8  3.3 - 5.5 (g/dL)   Calcium 9.2  8.0 - 09.6 (mg/dL)  CEA      Component Value Range   CEA <0.5  0.0 - 5.0 (ng/mL)   Filed Vitals:   10/02/11 1213 10/02/11 1224  BP:  117/79  Pulse:  83  Temp: 98.4 F (36.9 C)   Resp:  18  SpO2:  100%     Review of Systems  Constitutional: Negative for fever and chills.  Respiratory: Negative for cough and shortness of breath.   Cardiovascular: Negative for chest pain.  Gastrointestinal: Negative for nausea, vomiting and abdominal pain.  Neurological: Negative for headaches.  Endo/Heme/Allergies: Does not bruise/bleed easily.    Blood pressure 117/79, pulse 83, temperature 98.4 F (36.9 C), resp. rate 18, SpO2 100.00%. Physical Exam    Constitutional: She is oriented to person, place, and time. She appears well-developed and well-nourished.  Cardiovascular: Normal rate and regular rhythm.   Respiratory: Effort normal and breath sounds normal.  GI: Soft. Bowel sounds are normal. She exhibits no distension. There is no tenderness.  Musculoskeletal: Normal range of motion.  Neurological: She is alert and oriented to person, place, and time.     Assessment/Plan Patient with history of rectal adenocarcinoma and completion of therapy; plan is for port a cath removal.  Mischele Detter,D KEVIN 10/02/2011, 1:18 PM

## 2011-10-02 NOTE — ED Notes (Signed)
Patient denies pain and is resting comfortably.  

## 2011-12-02 ENCOUNTER — Encounter: Payer: Self-pay | Admitting: *Deleted

## 2011-12-17 ENCOUNTER — Ambulatory Visit (INDEPENDENT_AMBULATORY_CARE_PROVIDER_SITE_OTHER): Payer: BC Managed Care – PPO | Admitting: Cardiology

## 2011-12-17 ENCOUNTER — Encounter: Payer: Self-pay | Admitting: Cardiology

## 2011-12-17 VITALS — BP 120/70 | HR 72 | Resp 18 | Ht 66.0 in | Wt 168.0 lb

## 2011-12-17 DIAGNOSIS — R002 Palpitations: Secondary | ICD-10-CM

## 2011-12-17 MED ORDER — ASPIRIN 81 MG PO TABS: 81.0000 mg | ORAL_TABLET | Freq: Every day | ORAL | Status: DC

## 2011-12-17 NOTE — Assessment & Plan Note (Signed)
Patient was found to have a run of SVT (possible atrial flutter) at a rate of 150 bpm on holter monitor in 2011.  Her CHADSVASC score is 0.  I did a 3 week event monitor also in 3/11 to assess for arrhythmia burden after this and no SVT, atrial fib, or atrial flutter was noted.  She is on Toprol XL and has noted only rare palpitations since I last saw her a year ago.  She can decrease her ASA to 81 mg daily.  No further changes, I can see her back in a year unless symptoms change.

## 2011-12-17 NOTE — Patient Instructions (Signed)
Your physician wants you to follow-up in: 1 year with Dr McLean. You will receive a reminder letter in the mail two months in advance. If you don't receive a letter, please call our office to schedule the follow-up appointment.  

## 2011-12-17 NOTE — Progress Notes (Signed)
PCP: Dr. Zachery Dauer  56 yo presented initially for evaluation of palpitations/atrial arrhythmias. She was noted on 48 hour holter monitor done for palpitations to have an episode of SVT (possible atrial flutter) at a rate of 150 bpm.  I started her on low dose Toprol XL and her palpitations decreased considerably.  She did a 3 week event monitor in 3/11 on Toprol XL that showed no atrial fibrillation, atrial flutter, or other SVT. Since I last saw her, she has had only rare palpitations.  No long runs of tachycardia.  She has been treated for rectal cancer and is in remission.  Good exercise tolerance: she works out at [employer] twice a week and walks on other days.  No significant dyspnea with exertion or chest pain.   ECG: NSR, nonspecific Qs in V1 and V2  Labs (4/11): lipomed profile showed LDL 117, HDL 41, LDL size 21, small LDL-P 355.  This is not a high risk profile.  Labs (2/13): 4.1, creatinine 0.4  Allergies (verified):  No Known Drug Allergies  Past Medical History: 1. GERD 2. Atypical chest pain: Stress echo (2/11) showed EF 60%, normal valves and normal RV.  There was no evidence for ischemia with stress: all walls augmented appropriately.    3. Palpitations: Holter monitor (3/11) showed 1 run of SVT (possible atrial fiutter) at a rate of 150 bpm.  Event monitor (4/11) for 3 wks showed no atrial fib, atrial flutter, or other SVT.  4. DVT several years ago when on HRT.  Took coumadin for a few months.   Family History: Father was found to have CAD in his early 86s and had CABG at 84.  Grandfather had CABG at 26.  2 brothers are healthy.  Mother with atrial fibrillation.   Social History: Married, not working Tobacco Use - No.  Alcohol Use - no  Current Outpatient Prescriptions  Medication Sig Dispense Refill  . albuterol (PROVENTIL HFA;VENTOLIN HFA) 108 (90 BASE) MCG/ACT inhaler Inhale 2 puffs into the lungs every 6 (six) hours as needed. Wheezing      . aspirin 325 MG tablet Take  325 mg by mouth daily before breakfast.       . cholecalciferol (VITAMIN D) 1000 UNITS tablet Take 1,000 Units by mouth daily. 2 000 units daily.      . fish oil-omega-3 fatty acids 1000 MG capsule Take 1 g by mouth daily.       . metoprolol succinate (TOPROL-XL) 25 MG 24 hr tablet Take 12.5 mg by mouth at bedtime.       . pantoprazole (PROTONIX) 40 MG tablet TAKE 1 TABLET EVERY DAY  30 tablet  5  . Probiotic Product (ALIGN PO) Take by mouth. 1 tab daily       . ibuprofen (ADVIL,MOTRIN) 200 MG tablet Take 200-400 mg by mouth every 6 (six) hours as needed. Pain        BP 120/70  Pulse 72  Resp 18  Ht 5\' 6"  (1.676 m)  Wt 168 lb (76.204 kg)  BMI 27.12 kg/m2 General: NAD Neck: No JVD, no thyromegaly or thyroid nodule.  Lungs: Clear to auscultation bilaterally with normal respiratory effort. CV: Nondisplaced PMI.  Heart regular S1/S2, no S3/S4, no murmur.  No peripheral edema.  No carotid bruit.  Normal pedal pulses.  Abdomen: Soft, nontender, no hepatosplenomegaly, no distention.  Neurologic: Alert and oriented x 3.  Psych: Normal affect. Extremities: No clubbing or cyanosis.

## 2012-01-07 ENCOUNTER — Other Ambulatory Visit: Payer: Self-pay | Admitting: Cardiology

## 2012-01-08 NOTE — Telephone Encounter (Signed)
..   Requested Prescriptions   Pending Prescriptions Disp Refills  . pantoprazole (PROTONIX) 40 MG tablet [Pharmacy Med Name: PANTOPRAZOLE SOD DR 40 MG TAB] 30 tablet 10    Sig: TAKE 1 TABLET BY MOUTH EVERY DAY

## 2012-03-01 ENCOUNTER — Telehealth: Payer: Self-pay | Admitting: Hematology and Oncology

## 2012-03-01 NOTE — Telephone Encounter (Signed)
Moved 7/24 appt to 8/1 w/JH per LO (PAL). lmonvm for pt and  Mailed schedule. Also confirmed 7/22 appt.

## 2012-03-07 ENCOUNTER — Other Ambulatory Visit (HOSPITAL_BASED_OUTPATIENT_CLINIC_OR_DEPARTMENT_OTHER): Payer: BC Managed Care – PPO | Admitting: Lab

## 2012-03-07 DIAGNOSIS — C20 Malignant neoplasm of rectum: Secondary | ICD-10-CM

## 2012-03-07 LAB — CBC WITH DIFFERENTIAL/PLATELET
Basophils Absolute: 0 10*3/uL (ref 0.0–0.1)
Eosinophils Absolute: 0.1 10*3/uL (ref 0.0–0.5)
HGB: 12.6 g/dL (ref 11.6–15.9)
NEUT#: 3 10*3/uL (ref 1.5–6.5)
RBC: 4.07 10*6/uL (ref 3.70–5.45)
RDW: 12.9 % (ref 11.2–14.5)
WBC: 4.5 10*3/uL (ref 3.9–10.3)
lymph#: 1 10*3/uL (ref 0.9–3.3)

## 2012-03-07 LAB — COMPREHENSIVE METABOLIC PANEL
AST: 15 U/L (ref 0–37)
Albumin: 4.2 g/dL (ref 3.5–5.2)
Alkaline Phosphatase: 58 U/L (ref 39–117)
BUN: 15 mg/dL (ref 6–23)
Calcium: 9.6 mg/dL (ref 8.4–10.5)
Chloride: 104 mEq/L (ref 96–112)
Glucose, Bld: 91 mg/dL (ref 70–99)
Potassium: 4.4 mEq/L (ref 3.5–5.3)
Sodium: 138 mEq/L (ref 135–145)
Total Protein: 6.4 g/dL (ref 6.0–8.3)

## 2012-03-09 ENCOUNTER — Ambulatory Visit: Payer: BC Managed Care – PPO | Admitting: Hematology and Oncology

## 2012-03-17 ENCOUNTER — Encounter: Payer: Self-pay | Admitting: Family

## 2012-03-17 ENCOUNTER — Ambulatory Visit (HOSPITAL_BASED_OUTPATIENT_CLINIC_OR_DEPARTMENT_OTHER): Payer: BC Managed Care – PPO | Admitting: Family

## 2012-03-17 ENCOUNTER — Telehealth: Payer: Self-pay | Admitting: Hematology and Oncology

## 2012-03-17 VITALS — BP 128/74 | HR 85 | Temp 97.9°F | Ht 66.0 in | Wt 165.5 lb

## 2012-03-17 DIAGNOSIS — R32 Unspecified urinary incontinence: Secondary | ICD-10-CM

## 2012-03-17 DIAGNOSIS — C20 Malignant neoplasm of rectum: Secondary | ICD-10-CM

## 2012-03-17 DIAGNOSIS — N3943 Post-void dribbling: Secondary | ICD-10-CM

## 2012-03-17 NOTE — Progress Notes (Signed)
Patient ID: Colleen Kirby, female   DOB: September 17, 1955, 56 y.o.   MRN: [id number] CSN: [id number]  Identifying Statement: Colleen Kirby is a 56 y.o. Caucasian female who presents today for follow-up for rectal cancer.  CC: Colleen Kirby, M.D.  San Morelle, M.D.  Interval History: The patient and her husband are present for today's visit.  The patient states that overall she is feeling well.  The patient has ongoing urinary issues (incontinence, dribbling, recurrent UTIs) for which she is receiving urinary PT and visiting her Urologist, Dr. Wilson Singer.  The patient states that her Port-A-Cath has been removed. The patient states that she has occasional headaches and her tailbone has aches/pains that are worse at night.  The patient has spoken to her surgeon about the sacral area pain and he states that this is a normal finding.  The patient is concerned that her colonoscopy schedule (every 3 years) may not be frequent enough.  Dr. Dalene Carrow suggested that the patient speak again to her Gastroenterologist, Dr. Kinnie Scales.   The patient denies any other symptomatolgy or concerns.  Medications: Current Outpatient Prescriptions  Medication Sig Dispense Refill  . albuterol (PROVENTIL HFA;VENTOLIN HFA) 108 (90 BASE) MCG/ACT inhaler Inhale 2 puffs into the lungs every 6 (six) hours as needed. Wheezing      . aspirin 162 MG EC tablet Take 162 mg by mouth daily.      . cholecalciferol (VITAMIN D) 1000 UNITS tablet Take 1,000 Units by mouth daily. 2 000 units daily.      . fish oil-omega-3 fatty acids 1000 MG capsule Take 1 g by mouth daily.       Marland Kitchen ibuprofen (ADVIL,MOTRIN) 200 MG tablet Take 200-400 mg by mouth every 6 (six) hours as needed. Pain      . metoprolol succinate (TOPROL-XL) 25 MG 24 hr tablet Take 12.5 mg by mouth at bedtime.       . pantoprazole (PROTONIX) 40 MG tablet TAKE 1 TABLET BY MOUTH EVERY DAY  30 tablet  10  . Probiotic Product (ALIGN PO) Take by mouth. 1 tab daily        . DISCONTD: pantoprazole (PROTONIX) 40 MG tablet TAKE 1 TABLET EVERY DAY  30 tablet  5    Allergies  Allergen Reactions  . Adhesive (Tape) Itching and Rash    Family History: Family History  Problem Relation Age of Onset  . Coronary artery disease    . Arrhythmia    . Cancer Mother    Social History: History  Substance Use Topics  . Smoking status: Never Smoker   . Smokeless tobacco: Not on file  . Alcohol Use: No    Review of Systems:  10 point review of systems was completed and as noted above.    Physical Exam: Blood pressure 128/74, pulse 85, temperature 97.9 F (36.6 C), temperature source Oral, height 5\' 6"  (1.676 m), weight 165 lb 8 oz (75.07 kg).  General appearance: Alert, cooperative, well nourished, well developed, no distress Head: Normocephalic, without obvious abnormality, atraumatic Eyes: Conjunctivae/corneas clear, PERRLA, EOMI Nose: Nares, septum and mucosa are normal, no drainage or sinus tenderness Throat: Lips, mucosa, tongue, teeth and gums are normal Neck: No adenopathy, supple, symmetrical, trachea midline, thyroid not enlarged, no tenderness Resp: Clear to auscultation bilaterally Cardio: Regular rate and rhythm, S1, S2 normal, no murmur/click/rub/gallop GI: Soft, non-tender, non-distended, hypoactive bowel sounds, no organomegaly Extremities: Extremities normal, atraumatic, no cyanosis or edema Lymph nodes: Cervical, supraclavicular, and axillary  nodes normal   Laboratory Data: WBC is 4.5 RBCs 4.07, hemoglobin 12.6, hematocrit 36.6, MCV 89.8, sodium 138, potassium 4.4, chloride 104, CO2 29, BUN 15, creatinine 0.83, calcium 9.6, glucose 91, AST 15, ALT 14, albumin 4.2,CEA <0.05   Impression/Plan: Mrs. Fukuhara is a 56 year old woman who is status post abdominoperineal resection for moderately differentiated mucinous  adenocarcinoma on May 01, 2010. She is status post neoadjuvant 5-FU with radiation from 01/27/2010 to 03/16/2010  receiving a total of 50.4 Gy with continuous infusion 5-FU. Tumor was downgraded to T1 with pathology revealing lakes of mucin with sparse glandular epithelium in the submucosa with no evidence of LV invasion. None of the 10 lymph nodes sampled had evidence of malignancy. She began Xeloda in the adjuvant setting from July 02, 2010, to October 18, 2010. She received a total of 6 cycles. Mrs. Cribb's current scan indicates no evidence of recurrence. She will follow up in 6 months' time (February, 2014) with lab work and repeat CT of the chest and abd/pelvis.    Eligio Angert NP-C 03/17/2012, 11:58 AM

## 2012-03-17 NOTE — Patient Instructions (Signed)
Patient ID: Colleen Kirby,   DOB: 07/04/1956,  MRN: [id number]   [facility] Discharge Instructions  RECOMMENDATIONS MAD BY THE CONSULTANT AND ANY TEST RESULT(S) WILL BE FORWARDED TO YOU REFERRING DOCTOR   EXAM FINDINGS BY NURSE PRACTITIONER TODAY TO REPORT TO THE CLINIC OR PRIMARY PROVIDER: N/A   Your Current Medications Are: Current Outpatient Prescriptions  Medication Sig Dispense Refill  . albuterol (PROVENTIL HFA;VENTOLIN HFA) 108 (90 BASE) MCG/ACT inhaler Inhale 2 puffs into the lungs every 6 (six) hours as needed. Wheezing      . aspirin 162 MG EC tablet Take 162 mg by mouth daily.      . cholecalciferol (VITAMIN D) 1000 UNITS tablet Take 1,000 Units by mouth daily. 2 000 units daily.      . fish oil-omega-3 fatty acids 1000 MG capsule Take 1 g by mouth daily.       Marland Kitchen ibuprofen (ADVIL,MOTRIN) 200 MG tablet Take 200-400 mg by mouth every 6 (six) hours as needed. Pain      . metoprolol succinate (TOPROL-XL) 25 MG 24 hr tablet Take 12.5 mg by mouth at bedtime.       . pantoprazole (PROTONIX) 40 MG tablet TAKE 1 TABLET BY MOUTH EVERY DAY  30 tablet  10  . Probiotic Product (ALIGN PO) Take by mouth. 1 tab daily       . DISCONTD: pantoprazole (PROTONIX) 40 MG tablet TAKE 1 TABLET EVERY DAY  30 tablet  5     INSTRUCTIONS GIVEN, DISCUSSED AND FOLLOW-UP: Please follow up with your Urologist regarding urinary issues.  I acknowledge that I have been informed and understand all the instructions given to me and have received a copy.  I do not have any further questions at this time, but I understand that I may call the [facility] at [phone] during business hours should I have any further questions or need assistance in obtaining follow-up care.   03/17/2012, 11:56 AM

## 2012-03-17 NOTE — Telephone Encounter (Signed)
appts made and printed for pt aom °

## 2012-05-10 ENCOUNTER — Other Ambulatory Visit: Payer: Self-pay | Admitting: Cardiology

## 2012-06-09 ENCOUNTER — Other Ambulatory Visit: Payer: Self-pay | Admitting: Obstetrics & Gynecology

## 2012-06-09 DIAGNOSIS — Z1231 Encounter for screening mammogram for malignant neoplasm of breast: Secondary | ICD-10-CM

## 2012-06-20 ENCOUNTER — Telehealth: Payer: Self-pay | Admitting: *Deleted

## 2012-06-20 NOTE — Telephone Encounter (Signed)
Received call from pt wanting to let md know re:  For past several days, pt has been experiencing more cramping in the low groin area - more localized area with some discomfort , not so much pain.   Pt stated she has had the cramping before for a while but did not think much about reporting.   Pt stated her ostomy functions fine; pt voiding fine - pt is seeing urologist for radiation cystitis problem.    Pt wanted to know if md would consider moving CT scans to an earlier date sooner than Feb. Pt's   Cell phone     [phone].

## 2012-06-21 ENCOUNTER — Telehealth: Payer: Self-pay | Admitting: *Deleted

## 2012-06-21 NOTE — Telephone Encounter (Signed)
Spoke with pt and informed pt re:  Dr. Dalene Carrow would like pt to see urologist first,  As not a good idea to get CT scans for every symptoms.  Pt agreed with the scan issue.   Informed pt that md will wait for decision from urologist.   Pt stated she would contact the urologist.

## 2012-07-19 ENCOUNTER — Ambulatory Visit
Admission: RE | Admit: 2012-07-19 | Discharge: 2012-07-19 | Disposition: A | Discharge status: Home / Self Care | Payer: BC Managed Care – PPO | Source: Ambulatory Visit | Attending: Obstetrics & Gynecology | Admitting: Obstetrics & Gynecology

## 2012-07-19 DIAGNOSIS — Z1231 Encounter for screening mammogram for malignant neoplasm of breast: Secondary | ICD-10-CM

## 2012-08-20 ENCOUNTER — Telehealth: Payer: Self-pay | Admitting: Oncology

## 2012-08-20 ENCOUNTER — Encounter: Payer: Self-pay | Admitting: Oncology

## 2012-08-20 NOTE — Telephone Encounter (Signed)
s/w pt and she is aware of her new appt/phy,aware a letter and new sch will be mailed out      [facility] 08/20/12

## 2012-09-20 ENCOUNTER — Ambulatory Visit (HOSPITAL_COMMUNITY)
Admission: RE | Admit: 2012-09-20 | Discharge: 2012-09-20 | Disposition: A | Discharge status: Home / Self Care | Payer: BC Managed Care – PPO | Source: Ambulatory Visit | Attending: Family | Admitting: Family

## 2012-09-20 ENCOUNTER — Other Ambulatory Visit (HOSPITAL_BASED_OUTPATIENT_CLINIC_OR_DEPARTMENT_OTHER): Payer: BC Managed Care – PPO | Admitting: Lab

## 2012-09-20 ENCOUNTER — Encounter (HOSPITAL_COMMUNITY): Payer: Self-pay

## 2012-09-20 DIAGNOSIS — Z933 Colostomy status: Secondary | ICD-10-CM | POA: Insufficient documentation

## 2012-09-20 DIAGNOSIS — C20 Malignant neoplasm of rectum: Secondary | ICD-10-CM | POA: Insufficient documentation

## 2012-09-20 DIAGNOSIS — Z09 Encounter for follow-up examination after completed treatment for conditions other than malignant neoplasm: Secondary | ICD-10-CM | POA: Insufficient documentation

## 2012-09-20 DIAGNOSIS — IMO0002 Reserved for concepts with insufficient information to code with codable children: Secondary | ICD-10-CM | POA: Insufficient documentation

## 2012-09-20 DIAGNOSIS — K449 Diaphragmatic hernia without obstruction or gangrene: Secondary | ICD-10-CM | POA: Insufficient documentation

## 2012-09-20 LAB — COMPREHENSIVE METABOLIC PANEL (CC13)
ALT: 13 U/L (ref 0–55)
AST: 13 U/L (ref 5–34)
Alkaline Phosphatase: 59 U/L (ref 40–150)
CO2: 27 mEq/L (ref 22–29)
Creatinine: 0.9 mg/dL (ref 0.6–1.1)
Sodium: 141 mEq/L (ref 136–145)
Total Bilirubin: 0.69 mg/dL (ref 0.20–1.20)
Total Protein: 7.2 g/dL (ref 6.4–8.3)

## 2012-09-20 LAB — CBC WITH DIFFERENTIAL/PLATELET
BASO%: 0.3 % (ref 0.0–2.0)
Eosinophils Absolute: 0.1 10*3/uL (ref 0.0–0.5)
LYMPH%: 24.5 % (ref 14.0–49.7)
MCHC: 34.7 g/dL (ref 31.5–36.0)
MCV: 88.1 fL (ref 79.5–101.0)
MONO%: 9.8 % (ref 0.0–14.0)
NEUT#: 3.2 10*3/uL (ref 1.5–6.5)
Platelets: 274 10*3/uL (ref 145–400)
RBC: 4.31 10*6/uL (ref 3.70–5.45)
RDW: 12.7 % (ref 11.2–14.5)
WBC: 5 10*3/uL (ref 3.9–10.3)

## 2012-09-20 LAB — CEA: CEA: <0.5 ng/mL (ref 0.0–5.0)

## 2012-09-20 MED ORDER — IOHEXOL 300 MG/ML  SOLN
100.0000 mL | Freq: Once | INTRAMUSCULAR | Status: AC | PRN
  Administered 2012-09-20: 100 mL via INTRAVENOUS

## 2012-09-22 ENCOUNTER — Ambulatory Visit: Payer: BC Managed Care – PPO | Admitting: Hematology and Oncology

## 2012-09-23 ENCOUNTER — Telehealth: Payer: Self-pay | Admitting: Oncology

## 2012-09-23 ENCOUNTER — Ambulatory Visit (HOSPITAL_BASED_OUTPATIENT_CLINIC_OR_DEPARTMENT_OTHER): Payer: BC Managed Care – PPO | Admitting: Oncology

## 2012-09-23 VITALS — BP 110/73 | HR 83 | Temp 96.9°F | Resp 18 | Ht 66.0 in | Wt 169.2 lb

## 2012-09-23 DIAGNOSIS — Z85048 Personal history of other malignant neoplasm of rectum, rectosigmoid junction, and anus: Secondary | ICD-10-CM

## 2012-09-23 DIAGNOSIS — C189 Malignant neoplasm of colon, unspecified: Secondary | ICD-10-CM

## 2012-09-23 NOTE — Telephone Encounter (Signed)
Gave pt appt for 2/21 ML and flush sent to labs and x-ray °

## 2012-09-23 NOTE — Progress Notes (Signed)
Hematology and Oncology Follow Up Visit  Colleen Kirby [id number] [birth year] 57 y.o. 09/23/2012 1:56 PM Kirby,Colleen STEWART, MDBarnes, [name]   Principle Diagnosis: 57 year old with T3N0 rectal cancer diagnosed in 04/2010.    Prior Therapy: 1. She is status post neoadjuvant 5- FU with radiation from 01/27/2010 to 03/16/2010 receiving a total of 50.4 Gy with continuous infusion 5-FU. 2. status  post abdominoperineal resection for moderately differentiated mucinous adenocarcinoma on May 01, 2010. Tumor was downgraded to T1 with pathology revealing lakes of mucin with sparse glandular epithelium in the submucosa with no evidence of LV invasion. None of the 10 lymph  nodes sampled had evidence of malignancy. 3. She began Xeloda in the adjuvant setting from July 02, 2010, to October 18, 2010. She received a total of 6 cycles.   Current therapy: Observation and follow up.   Interim History:  Mrs. Plant presents today for a follow up visit. She is a pleasant women with the a above diagnosis. The patient states that overall she is feeling well. The patient has ongoing urinary issues (incontinence, dribbling, recurrent UTIs) for which she is receiving urinary PT and visiting her Urologist. The patient states that her Port-A-Cath has been removed without complications. The patient states that she has occasional headaches and her tailbone has aches/pains that are worse at night. The patient denies any other symptomatolgy such as GI or bowel changes.    Medications: I have reviewed the patient's current medications. Current outpatient prescriptions:albuterol (PROVENTIL HFA;VENTOLIN HFA) 108 (90 BASE) MCG/ACT inhaler, Inhale 2 puffs into the lungs every 6 (six) hours as needed. Wheezing, Disp: , Rfl: ;  aspirin 162 MG EC tablet, Take 162 mg by mouth daily., Disp: , Rfl: ;  calcium citrate (CALCITRATE - DOSED IN MG ELEMENTAL CALCIUM) 950 MG tablet, Take 1 tablet by mouth daily., Disp:  , Rfl:  estradiol (ESTRACE) 0.1 MG/GM vaginal cream, Place 2 g vaginally. 2 times weekly, Disp: , Rfl: ;  fish oil-omega-3 fatty acids 1000 MG capsule, Take 1 g by mouth daily. , Disp: , Rfl: ;  hydrALAZINE (APRESOLINE) 10 MG tablet, Take 10 mg by mouth at bedtime. Use as directed, Disp: , Rfl: ;  ibuprofen (ADVIL,MOTRIN) 200 MG tablet, Take 200-400 mg by mouth every 6 (six) hours as needed. Pain, Disp: , Rfl:  metoprolol succinate (TOPROL-XL) 25 MG 24 hr tablet, Take 12.5 mg by mouth at bedtime. , Disp: , Rfl: ;  metoprolol succinate (TOPROL-XL) 25 MG 24 hr tablet, TAKE 1 TABLET EVERY DAY, Disp: 30 tablet, Rfl: 6;  pantoprazole (PROTONIX) 40 MG tablet, TAKE 1 TABLET BY MOUTH EVERY DAY, Disp: 30 tablet, Rfl: 10;  Probiotic Product (ALIGN PO), Take by mouth. 1 tab daily , Disp: , Rfl:  cholecalciferol (VITAMIN D) 1000 UNITS tablet, Take 1,000 Units by mouth daily. 2 000 units daily., Disp: , Rfl:   Allergies:  Allergies  Allergen Reactions  . Adhesive (Tape) Itching and Rash    Past Medical History, Surgical history, Social history, and Family History were reviewed and updated.  Review of Systems: Constitutional:  Negative for fever, chills, night sweats, anorexia, weight loss, pain. Cardiovascular: no chest pain or dyspnea on exertion Respiratory: negative Neurological: negative Dermatological: negative ENT: negative Skin: Negative. Gastrointestinal: negative Genito-Urinary: negative Hematological and Lymphatic: negative Breast: negative Musculoskeletal: negative Remaining ROS negative. Physical Exam: Blood pressure 110/73, pulse 83, temperature 96.9 F (36.1 C), temperature source Oral, resp. rate 18, height 5\' 6"  (1.676 m), weight 169 lb 3.2 oz (  76.749 kg). ECOG: 0 General appearance: alert Head: Normocephalic, without obvious abnormality, atraumatic Neck: no adenopathy, no carotid bruit, no JVD, supple, symmetrical, trachea midline and thyroid not enlarged, symmetric, no  tenderness/mass/nodules Lymph nodes: Cervical, supraclavicular, and axillary nodes normal. Heart:regular rate and rhythm, S1, S2 normal, no murmur, click, rub or gallop Lung:chest clear, no wheezing, rales, normal symmetric air entry Abdomin: soft, non-tender, without masses or organomegaly EXT:no erythema, induration, or nodules   Lab Results: Lab Results  Component Value Date   WBC 5.0 09/20/2012   HGB 13.2 09/20/2012   HCT 38.0 09/20/2012   MCV 88.1 09/20/2012   PLT 274 09/20/2012     Chemistry      Component Value Date/Time   NA 141 09/20/2012 0922   NA 138 03/07/2012 1112   NA 142 09/21/2011 0931   K 4.6 09/20/2012 0922   K 4.4 03/07/2012 1112   K 4.1 09/21/2011 0931   CL 104 09/20/2012 0922   CL 104 03/07/2012 1112   CL 102 09/21/2011 0931   CO2 27 09/20/2012 0922   CO2 29 03/07/2012 1112   CO2 29 09/21/2011 0931   BUN 12.8 09/20/2012 0922   BUN 15 03/07/2012 1112   BUN 17 09/21/2011 0931   CREATININE 0.9 09/20/2012 0922   CREATININE 0.83 03/07/2012 1112   CREATININE 0.9 09/21/2011 0931      Component Value Date/Time   CALCIUM 9.3 09/20/2012 0922   CALCIUM 9.6 03/07/2012 1112   CALCIUM 9.2 09/21/2011 0931   ALKPHOS 59 09/20/2012 0922   ALKPHOS 58 03/07/2012 1112   ALKPHOS 62 09/21/2011 0931   AST 13 09/20/2012 0922   AST 15 03/07/2012 1112   AST 20 09/21/2011 0931   ALT 13 09/20/2012 0922   ALT 14 03/07/2012 1112   BILITOT 0.69 09/20/2012 0922   BILITOT 0.4 03/07/2012 1112   BILITOT 0.80 09/21/2011 0931       Radiological Studies:  CT CHEST, ABDOMEN AND PELVIS WITH CONTRAST  Technique: Multidetector CT imaging of the chest, abdomen and  pelvis was performed following the standard protocol during bolus  administration of intravenous contrast.  Contrast: OMNIPAQUE IOHEXOL 300 MG/ML SOLN  Comparison: History of rectal cancer  CT CHEST  Findings: Lungs/pleura: There is no pleural effusion. No airspace  consolidation identified. The lungs appear clear.  Heart/Mediastinum: Heart size is normal. There is  no pericardial  effusion. No mediastinal or hilar adenopathy. No supraclavicular  lymph nodes.  Bones/Musculoskeletal: No axillary adenopathy identified. There  are no worrisome lytic or sclerotic bone lesions identified.  IMPRESSION:  1. No acute findings. No evidence for metastatic disease.  CT ABDOMEN AND PELVIS  Findings: There are no focal liver abnormalities. The gallbladder  appears within normal limits. The pancreas appears normal. No  biliary dilatation. Normal appearance of the spleen.  The adrenal glands both appear normal. Normal appearance of both  kidneys. The urinary bladder is within normal limits. The uterus  and adnexal structures are unremarkable.  No adenopathy within the upper abdomen. There is no pelvic or  inguinal adenopathy. No free fluid or fluid collections noted  within the abdomen or the pelvis. Small hiatal hernia noted. The  stomach is otherwise unremarkable. 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  Presacral fibrotic changes appear similar to previous exam.  Review of the visualized bony structures is significant for mild  lumbar spondylosis. No worrisome lytic or sclerotic bone lesions.  IMPRESSION:  1. No  acute findings identified within the abdomen or pelvis.  2. No evidence for residual tumor or metastatic disease  Impression and Plan: 57 year old woman with:  1. T3N0 rectal cancer diagnosed in 2011. She is status post abdominoperineal resection for moderately differentiated mucinous  adenocarcinoma on May 01, 2010. She is status post neoadjuvant 5-FU with radiation from 01/27/2010 to 03/16/2010. She began Xeloda in the adjuvant setting from July 02, 2010, to October 18, 2010. She received a total of 6 cycles. She is on active follow up at this time. Her CT scan and labs discussed today and did not show any disease.  The plan is continue follow up in 6 months and repeat CT scan  in 12 months.   2. Stress incontinence. She is following up with Urology at this point.   3. Colonoscopy follow up: she will be due in 2015.      Eli Hose, MD 2/7/20141:56 PM

## 2012-12-05 ENCOUNTER — Other Ambulatory Visit: Payer: Self-pay | Admitting: Cardiology

## 2013-01-06 ENCOUNTER — Telehealth: Payer: Self-pay | Admitting: Oncology

## 2013-01-11 ENCOUNTER — Ambulatory Visit: Payer: BC Managed Care – PPO | Admitting: Cardiology

## 2013-01-12 ENCOUNTER — Ambulatory Visit (INDEPENDENT_AMBULATORY_CARE_PROVIDER_SITE_OTHER): Payer: BC Managed Care – PPO | Admitting: Cardiology

## 2013-01-12 ENCOUNTER — Encounter: Payer: Self-pay | Admitting: Cardiology

## 2013-01-12 VITALS — BP 112/62 | HR 72 | Ht 66.0 in | Wt 173.0 lb

## 2013-01-12 DIAGNOSIS — R002 Palpitations: Secondary | ICD-10-CM

## 2013-01-12 NOTE — Progress Notes (Signed)
Patient ID: Colleen Kirby, female   DOB: Dec 05, 1955, 57 y.o.   MRN: [id number] PCP: Dr. Zachery Dauer  57 yo presented initially for evaluation of palpitations/atrial arrhythmias. She was noted on 48 hour holter monitor done for palpitations to have an episode of SVT (possible atrial flutter) at a rate of 150 bpm.  I started her on low dose Toprol XL and her palpitations decreased considerably.  She did a 3 week event monitor in 3/11 on Toprol XL that showed no atrial fibrillation, atrial flutter, or other SVT. Since I last saw her, she has had only rare palpitations.  No long runs of tachycardia. Good exercise tolerance: she works out at [employer] twice a week and walks on other days.  No significant dyspnea with exertion or chest pain.   ECG: NSR, normal  Labs (4/11): lipomed profile showed LDL 117, HDL 41, LDL size 21, small LDL-P 355.  This is not a high risk profile.  Labs (2/13): 4.1, creatinine 0.4 Labs (2/14): K 4.6, creatinine 0.9  Allergies (verified):  No Known Drug Allergies  Past Medical History: 1. GERD 2. Atypical chest pain: Stress echo (2/11) showed EF 60%, normal valves and normal RV.  There was no evidence for ischemia with stress: all walls augmented appropriately.    3. Palpitations: Holter monitor (3/11) showed 1 run of SVT (possible atrial fiutter) at a rate of 150 bpm.  Event monitor (4/11) for 3 wks showed no atrial fib, atrial flutter, or other SVT.  4. DVT several years ago when on HRT.  Took coumadin for a few months.   Family History: Father was found to have CAD in his early 56s and had CABG at 53.  Grandfather had CABG at 61.  2 brothers are healthy.  Mother with atrial fibrillation.   Social History: Married, not working Tobacco Use - No.  Alcohol Use - no  Current Outpatient Prescriptions  Medication Sig Dispense Refill  . albuterol (PROVENTIL HFA;VENTOLIN HFA) 108 (90 BASE) MCG/ACT inhaler Inhale 2 puffs into the lungs every 6 (six) hours as needed. Wheezing       . Alpha-D-Galactosidase (BEANO PO) Take by mouth as needed.      Marland Kitchen aspirin 162 MG EC tablet Take 162 mg by mouth daily.      . Calcium Citrate-Vitamin D (CITRACAL + D PO) Take by mouth. Vitamin c 1200mg , vitamin d 2000 units      . cycloSPORINE (RESTASIS) 0.05 % ophthalmic emulsion 1 drop every evening.      Marland Kitchen estradiol (ESTRACE) 0.1 MG/GM vaginal cream Place 2 g vaginally. 2 times weekly      . fish oil-omega-3 fatty acids 1000 MG capsule Take 1 g by mouth daily.       . hydrALAZINE (APRESOLINE) 10 MG tablet Take 10 mg by mouth at bedtime. Use as directed      . ibuprofen (ADVIL,MOTRIN) 200 MG tablet Take 200-400 mg by mouth every 6 (six) hours as needed. Pain      . metoprolol succinate (TOPROL-XL) 25 MG 24 hr tablet Take 12.5 mg by mouth at bedtime.       . pantoprazole (PROTONIX) 40 MG tablet TAKE 1 TABLET BY MOUTH EVERY DAY  30 tablet  5  . Probiotic Product (ALIGN PO) Take by mouth. 1 tab daily        No current facility-administered medications for this visit.    BP 112/62  Pulse 72  Ht 5\' 6"  (1.676 m)  Wt 173 lb (78.472  kg)  BMI 27.94 kg/m2 General: NAD Neck: No JVD, no thyromegaly or thyroid nodule.  Lungs: Clear to auscultation bilaterally with normal respiratory effort. CV: Nondisplaced PMI.  Heart regular S1/S2, no S3/S4, no murmur.  No peripheral edema.  No carotid bruit.  Normal pedal pulses.  Abdomen: Soft, nontender, no hepatosplenomegaly, no distention.  Neurologic: Alert and oriented x 3.  Psych: Normal affect. Extremities: No clubbing or cyanosis.   57 yo with history of SVT, possibly atrial flutter.  She has had only occasional mild palpitations since she started Toprol XL.  She is in NSR today.  CHADSVASC score = 1, so if the arrhythmia seen on monitor in the past was indeed atrial flutter, ASA should suffice.    I will see her back as needed.   [name] 01/12/2013

## 2013-01-12 NOTE — Patient Instructions (Addendum)
Your physician recommends that you schedule a follow-up appointment as needed with Dr McLean.  

## 2013-03-06 ENCOUNTER — Other Ambulatory Visit: Payer: Self-pay | Admitting: Obstetrics and Gynecology

## 2013-03-06 DIAGNOSIS — N644 Mastodynia: Secondary | ICD-10-CM

## 2013-03-06 DIAGNOSIS — N6452 Nipple discharge: Secondary | ICD-10-CM

## 2013-03-07 ENCOUNTER — Other Ambulatory Visit (HOSPITAL_BASED_OUTPATIENT_CLINIC_OR_DEPARTMENT_OTHER): Payer: BC Managed Care – PPO | Admitting: Lab

## 2013-03-07 DIAGNOSIS — C189 Malignant neoplasm of colon, unspecified: Secondary | ICD-10-CM

## 2013-03-07 LAB — CBC WITH DIFFERENTIAL/PLATELET
Basophils Absolute: 0 10*3/uL (ref 0.0–0.1)
Eosinophils Absolute: 0.1 10*3/uL (ref 0.0–0.5)
HCT: 36.5 % (ref 34.8–46.6)
HGB: 12.7 g/dL (ref 11.6–15.9)
NEUT#: 2.7 10*3/uL (ref 1.5–6.5)
RDW: 12.8 % (ref 11.2–14.5)
lymph#: 1.2 10*3/uL (ref 0.9–3.3)

## 2013-03-07 LAB — COMPREHENSIVE METABOLIC PANEL (CC13)
Albumin: 3.7 g/dL (ref 3.5–5.0)
BUN: 14.3 mg/dL (ref 7.0–26.0)
CO2: 26 mEq/L (ref 22–29)
Calcium: 9.4 mg/dL (ref 8.4–10.4)
Chloride: 105 mEq/L (ref 98–109)
Glucose: 117 mg/dl (ref 70–140)
Potassium: 4.1 mEq/L (ref 3.5–5.1)

## 2013-03-08 LAB — CEA: CEA: <0.5 ng/mL (ref 0.0–5.0)

## 2013-03-14 ENCOUNTER — Other Ambulatory Visit: Payer: BC Managed Care – PPO | Admitting: Lab

## 2013-03-14 ENCOUNTER — Telehealth: Payer: Self-pay | Admitting: Oncology

## 2013-03-14 ENCOUNTER — Ambulatory Visit (HOSPITAL_BASED_OUTPATIENT_CLINIC_OR_DEPARTMENT_OTHER): Payer: BC Managed Care – PPO | Admitting: Oncology

## 2013-03-14 VITALS — BP 120/74 | HR 75 | Temp 96.7°F | Resp 18 | Ht 66.0 in | Wt 170.1 lb

## 2013-03-14 DIAGNOSIS — C189 Malignant neoplasm of colon, unspecified: Secondary | ICD-10-CM

## 2013-03-14 NOTE — Progress Notes (Signed)
Hematology and Oncology Follow Up Visit  Colleen Kirby [id number] 09-10-1955 57 y.o. 03/14/2013 9:00 AM [name],Colleen STEWART, Kirby, [name]   Principle Diagnosis: 57 year old with T3N0 rectal cancer diagnosed in 04/2010.    Prior Therapy: 1. She is status post neoadjuvant 5- FU with radiation from 01/27/2010 to 03/16/2010 receiving a total of 50.4 Gy with continuous infusion 5-FU. 2. status  post abdominoperineal resection for moderately differentiated mucinous adenocarcinoma on May 01, 2010. Tumor was downgraded to T1 with pathology revealing lakes of mucin with sparse glandular epithelium in the submucosa with no evidence of LV invasion. None of the 10 lymph  nodes sampled had evidence of malignancy. 3. She began Xeloda in the adjuvant setting from July 02, 2010, to October 18, 2010. She received a total of 6 cycles.   Current therapy: Observation and follow up.   Interim History:  Colleen Kirby presents today for a follow up visit. She is a pleasant women with the a above diagnosis. The patient states that overall she is feeling well. She was diagnosed with mastitis and was treated with keflex and this has resolved. The patient states that her Port-A-Cath has been removed without complications. The patient states that she has occasional headaches and her tailbone has aches/pains that are worse at night. The patient denies any other symptomatolgy such as GI or bowel changes. She report GERD symptoms and on PPI for now.    Medications: I have reviewed the patient's current medications. Current Outpatient Prescriptions  Medication Sig Dispense Refill  . albuterol (PROVENTIL HFA;VENTOLIN HFA) 108 (90 BASE) MCG/ACT inhaler Inhale 2 puffs into the lungs every 6 (six) hours as needed. Wheezing      . Alpha-D-Galactosidase (BEANO PO) Take by mouth as needed.      Marland Kitchen aspirin 162 MG EC tablet Take 162 mg by mouth daily.      . Calcium Citrate-Vitamin D (CITRACAL + D PO)  Take by mouth. Vitamin c 1200mg , vitamin d 2000 units      . cycloSPORINE (RESTASIS) 0.05 % ophthalmic emulsion 1 drop every evening.      Marland Kitchen estradiol (ESTRACE) 0.1 MG/GM vaginal cream Place 2 g vaginally. 2 times weekly      . fish oil-omega-3 fatty acids 1000 MG capsule Take 1 g by mouth daily.       . hydrOXYzine (ATARAX/VISTARIL) 10 MG tablet Take 10 mg by mouth at bedtime as needed for itching. Patient takes at bedtime      . ibuprofen (ADVIL,MOTRIN) 200 MG tablet Take 200-400 mg by mouth every 6 (six) hours as needed. Pain      . metoprolol succinate (TOPROL-XL) 12.5 mg TB24 Take 12.5 mg by mouth daily.      . pantoprazole (PROTONIX) 40 MG tablet TAKE 1 TABLET BY MOUTH EVERY DAY  30 tablet  5  . Probiotic Product (ALIGN PO) Take by mouth. 1 tab daily        No current facility-administered medications for this visit.    Allergies:  Allergies  Allergen Reactions  . Adhesive (Tape) Itching and Rash    Past Medical History, Surgical history, Social history, and Family History were reviewed and updated.  Review of Systems: Constitutional:  Negative for fever, chills, night sweats, anorexia, weight loss, pain. Cardiovascular: no chest pain or dyspnea on exertion Respiratory: negative Neurological: negative Dermatological: negative ENT: negative Skin: Negative. Gastrointestinal: negative Genito-Urinary: negative Hematological and Lymphatic: negative Breast: negative Musculoskeletal: negative Remaining ROS negative. Physical Exam: Blood pressure 120/74,  pulse 75, temperature 96.7 F (35.9 C), temperature source Oral, resp. rate 18, height 5\' 6"  (1.676 m), weight 170 lb 1.6 oz (77.157 kg). ECOG: 0 General appearance: alert Head: Normocephalic, without obvious abnormality, atraumatic Neck: no adenopathy, no carotid bruit, no JVD, supple, symmetrical, trachea midline and thyroid not enlarged, symmetric, no tenderness/mass/nodules Lymph nodes: Cervical, supraclavicular, and  axillary nodes normal. Heart:regular rate and rhythm, S1, S2 normal, no murmur, click, rub or gallop Lung:chest clear, no wheezing, rales, normal symmetric air entry Abdomin: soft, non-tender, without masses or organomegaly EXT:no erythema, induration, or nodules   Lab Results: Lab Results  Component Value Date   WBC 4.5 03/07/2013   HGB 12.7 03/07/2013   HCT 36.5 03/07/2013   MCV 89.1 03/07/2013   PLT 274 03/07/2013     Chemistry      Component Value Date/Time   NA 140 03/07/2013 0932   NA 138 03/07/2012 1112   NA 142 09/21/2011 0931   K 4.1 03/07/2013 0932   K 4.4 03/07/2012 1112   K 4.1 09/21/2011 0931   CL 104 09/20/2012 0922   CL 104 03/07/2012 1112   CL 102 09/21/2011 0931   CO2 26 03/07/2013 0932   CO2 29 03/07/2012 1112   CO2 29 09/21/2011 0931   BUN 14.3 03/07/2013 0932   BUN 15 03/07/2012 1112   BUN 17 09/21/2011 0931   CREATININE 0.9 03/07/2013 0932   CREATININE 0.83 03/07/2012 1112   CREATININE 0.9 09/21/2011 0931      Component Value Date/Time   CALCIUM 9.4 03/07/2013 0932   CALCIUM 9.6 03/07/2012 1112   CALCIUM 9.2 09/21/2011 0931   ALKPHOS 52 03/07/2013 0932   ALKPHOS 58 03/07/2012 1112   ALKPHOS 62 09/21/2011 0931   AST 14 03/07/2013 0932   AST 15 03/07/2012 1112   AST 20 09/21/2011 0931   ALT 10 03/07/2013 0932   ALT 14 03/07/2012 1112   ALT 19 09/21/2011 0931   BILITOT 0.57 03/07/2013 0932   BILITOT 0.4 03/07/2012 1112   BILITOT 0.80 09/21/2011 0931      Impression and Plan: 57 year old woman with:  1. T3N0 rectal cancer diagnosed in 2011. She is status post abdominoperineal resection for moderately differentiated mucinous  adenocarcinoma on May 01, 2010. She is status post neoadjuvant 5-FU with radiation from 01/27/2010 to 03/16/2010. She began Xeloda in the adjuvant setting from July 02, 2010, to October 18, 2010. She received a total of 6 cycles. She is on active follow up at this time. Her CT scan from 09/2012 did not show any disease.  The plan is continue follow up in 6  months and repeat CT scan at that time.   2. Mastitis: she is up to date with mammograms but they will be repeated this week in any case.   3. Colonoscopy follow up: she will be due in 2015.   4. GERD: I recommended follow up with her GI doctor for possible endoscopy if symptoms persist.      Gearlene Godsil, MD 7/29/20149:00 AM

## 2013-03-14 NOTE — Telephone Encounter (Signed)
Gave pt appt for lab and MD on January 2014 , gave pt oral conttrast for CT before MD visit

## 2013-03-17 ENCOUNTER — Ambulatory Visit
Admission: RE | Admit: 2013-03-17 | Discharge: 2013-03-17 | Disposition: A | Discharge status: Home / Self Care | Payer: BC Managed Care – PPO | Source: Ambulatory Visit | Attending: Obstetrics and Gynecology | Admitting: Obstetrics and Gynecology

## 2013-03-17 DIAGNOSIS — N644 Mastodynia: Secondary | ICD-10-CM

## 2013-03-17 DIAGNOSIS — N6452 Nipple discharge: Secondary | ICD-10-CM

## 2013-03-20 ENCOUNTER — Other Ambulatory Visit: Payer: BC Managed Care – PPO

## 2013-03-23 ENCOUNTER — Ambulatory Visit: Payer: BC Managed Care – PPO | Admitting: Oncology

## 2013-06-06 ENCOUNTER — Other Ambulatory Visit: Payer: Self-pay | Admitting: Cardiology

## 2013-06-29 ENCOUNTER — Other Ambulatory Visit: Payer: Self-pay

## 2013-06-29 DIAGNOSIS — Z1231 Encounter for screening mammogram for malignant neoplasm of breast: Secondary | ICD-10-CM

## 2013-07-06 ENCOUNTER — Other Ambulatory Visit: Payer: Self-pay | Admitting: Cardiology

## 2013-08-21 ENCOUNTER — Ambulatory Visit
Admission: RE | Admit: 2013-08-21 | Discharge: 2013-08-21 | Disposition: A | Discharge status: Home / Self Care | Payer: BC Managed Care – PPO | Source: Ambulatory Visit

## 2013-08-21 DIAGNOSIS — Z1231 Encounter for screening mammogram for malignant neoplasm of breast: Secondary | ICD-10-CM

## 2013-09-14 ENCOUNTER — Ambulatory Visit (HOSPITAL_COMMUNITY)
Admission: RE | Admit: 2013-09-14 | Discharge: 2013-09-14 | Disposition: A | Discharge status: Home / Self Care | Payer: BC Managed Care – PPO | Source: Ambulatory Visit | Attending: Oncology | Admitting: Oncology

## 2013-09-14 ENCOUNTER — Other Ambulatory Visit (HOSPITAL_BASED_OUTPATIENT_CLINIC_OR_DEPARTMENT_OTHER): Payer: BC Managed Care – PPO

## 2013-09-14 ENCOUNTER — Encounter (HOSPITAL_COMMUNITY): Payer: Self-pay

## 2013-09-14 DIAGNOSIS — IMO0002 Reserved for concepts with insufficient information to code with codable children: Secondary | ICD-10-CM | POA: Insufficient documentation

## 2013-09-14 DIAGNOSIS — C189 Malignant neoplasm of colon, unspecified: Secondary | ICD-10-CM

## 2013-09-14 DIAGNOSIS — C20 Malignant neoplasm of rectum: Secondary | ICD-10-CM

## 2013-09-14 LAB — CBC WITH DIFFERENTIAL/PLATELET
BASO%: 0.7 % (ref 0.0–2.0)
Basophils Absolute: 0 10*3/uL (ref 0.0–0.1)
EOS ABS: 0.1 10*3/uL (ref 0.0–0.5)
EOS%: 1.8 % (ref 0.0–7.0)
HCT: 38.3 % (ref 34.8–46.6)
HEMOGLOBIN: 13 g/dL (ref 11.6–15.9)
LYMPH#: 1.1 10*3/uL (ref 0.9–3.3)
LYMPH%: 25.4 % (ref 14.0–49.7)
MCH: 30.5 pg (ref 25.1–34.0)
MCHC: 34 g/dL (ref 31.5–36.0)
MCV: 89.6 fL (ref 79.5–101.0)
MONO#: 0.4 10*3/uL (ref 0.1–0.9)
MONO%: 10.1 % (ref 0.0–14.0)
NEUT%: 62 % (ref 38.4–76.8)
NEUTROS ABS: 2.7 10*3/uL (ref 1.5–6.5)
Platelets: 248 10*3/uL (ref 145–400)
RBC: 4.28 10*6/uL (ref 3.70–5.45)
RDW: 13 % (ref 11.2–14.5)
WBC: 4.4 10*3/uL (ref 3.9–10.3)

## 2013-09-14 LAB — CEA: CEA: <0.5 ng/mL (ref 0.0–5.0)

## 2013-09-14 LAB — COMPREHENSIVE METABOLIC PANEL (CC13)
ALBUMIN: 4.3 g/dL (ref 3.5–5.0)
ALT: 15 U/L (ref 0–55)
AST: 17 U/L (ref 5–34)
Alkaline Phosphatase: 57 U/L (ref 40–150)
Anion Gap: 10 mEq/L (ref 3–11)
BUN: 14.5 mg/dL (ref 7.0–26.0)
CHLORIDE: 104 meq/L (ref 98–109)
CO2: 27 mEq/L (ref 22–29)
Calcium: 9.8 mg/dL (ref 8.4–10.4)
Creatinine: 0.8 mg/dL (ref 0.6–1.1)
Glucose: 95 mg/dl (ref 70–140)
POTASSIUM: 4.1 meq/L (ref 3.5–5.1)
Sodium: 142 mEq/L (ref 136–145)
TOTAL PROTEIN: 7.4 g/dL (ref 6.4–8.3)
Total Bilirubin: 0.78 mg/dL (ref 0.20–1.20)

## 2013-09-14 MED ORDER — IOHEXOL 300 MG/ML  SOLN
100.0000 mL | Freq: Once | INTRAMUSCULAR | Status: AC | PRN
  Administered 2013-09-14: 100 mL via INTRAVENOUS

## 2013-09-21 ENCOUNTER — Ambulatory Visit (HOSPITAL_BASED_OUTPATIENT_CLINIC_OR_DEPARTMENT_OTHER): Payer: BC Managed Care – PPO | Admitting: Oncology

## 2013-09-21 ENCOUNTER — Telehealth: Payer: Self-pay | Admitting: Oncology

## 2013-09-21 ENCOUNTER — Encounter: Payer: Self-pay | Admitting: Oncology

## 2013-09-21 VITALS — BP 116/70 | HR 71 | Temp 96.9°F | Resp 18 | Ht 66.0 in | Wt 173.7 lb

## 2013-09-21 DIAGNOSIS — C189 Malignant neoplasm of colon, unspecified: Secondary | ICD-10-CM

## 2013-09-21 DIAGNOSIS — C20 Malignant neoplasm of rectum: Secondary | ICD-10-CM

## 2013-09-21 NOTE — Progress Notes (Signed)
Hematology and Oncology Follow Up Visit  Colleen Kirby [id number] July 25, 1956 58 y.o. 09/21/2013 8:48 AM [name],Colleen Kirby, MDBarnes, Colleen Kirby*   Principle Diagnosis: 58 year old with T3N0 rectal cancer diagnosed in 04/2010.    Prior Therapy: 1. She is status post neoadjuvant 5- FU with radiation from 01/27/2010 to 03/16/2010 receiving a total of 50.4 Gy with continuous infusion 5-FU. 2. status  post abdominoperineal resection for moderately differentiated mucinous adenocarcinoma on May 01, 2010. Tumor was downgraded to T1 with pathology revealing lakes of mucin with sparse glandular epithelium in the submucosa with no evidence of LV invasion. None of the 10 lymph  nodes sampled had evidence of malignancy. 3. She began Xeloda in the adjuvant setting from July 02, 2010, to October 18, 2010. She received a total of 6 cycles.   Current therapy: Observation and follow up.   Interim History:  Mrs. Asbill presents today for a follow up visit. She is a pleasant women with the a above diagnosis. The patient states that overall she is feeling well. She reports occasional wheezing when lying flat at night trying to sleep. She denied shortness of breath or fevers. The patient states that her Port-A-Cath has been removed without complications. The patient states that she has occasional headaches and her tailbone has aches/pains that are worse at night. The patient denies any other symptomatolgy such as GI or bowel changes. She has not reported any hematochezia or melena. Has not reported any major changes in her performance status and activity level.   Medications: I have reviewed the patient's current medications. Current Outpatient Prescriptions  Medication Sig Dispense Refill  . albuterol (PROVENTIL HFA;VENTOLIN HFA) 108 (90 BASE) MCG/ACT inhaler Inhale 2 puffs into the lungs every 6 (six) hours as needed. Wheezing      . Alpha-D-Galactosidase (BEANO PO) Take by mouth as needed.       Marland Kitchen aspirin 162 MG EC tablet Take 162 mg by mouth daily.      . Calcium Citrate-Vitamin D (CITRACAL + D PO) Take by mouth. Vitamin c 1200mg , vitamin d 2000 units      . cycloSPORINE (RESTASIS) 0.05 % ophthalmic emulsion 1 drop every evening.      Marland Kitchen estradiol (ESTRACE) 0.1 MG/GM vaginal cream Place 2 g vaginally. 2 times weekly      . fish oil-omega-3 fatty acids 1000 MG capsule Take 1 g by mouth daily.       . hydrOXYzine (ATARAX/VISTARIL) 10 MG tablet Take 10 mg by mouth at bedtime as needed for itching. Patient takes at bedtime      . ibuprofen (ADVIL,MOTRIN) 200 MG tablet Take 200-400 mg by mouth every 6 (six) hours as needed. Pain      . metoprolol succinate (TOPROL-XL) 12.5 mg TB24 Take 12.5 mg by mouth daily.      . metoprolol succinate (TOPROL-XL) 25 MG 24 hr tablet Take 0.5 tablets (12.5 mg total) by mouth daily.  30 tablet  6  . pantoprazole (PROTONIX) 40 MG tablet TAKE 1 TABLET BY MOUTH DAILY  30 tablet  5  . Probiotic Product (ALIGN PO) Take by mouth. 1 tab daily        No current facility-administered medications for this visit.    Allergies:  Allergies  Allergen Reactions  . Adhesive [Tape] Itching and Rash    Past Medical History, Surgical history, Social history, and Family History were reviewed and updated.  Review of Systems: Constitutional:  Negative for fever, chills, night sweats, anorexia, weight loss,  pain. Cardiovascular: no chest pain or dyspnea on exertion  Remaining ROS negative. Physical Exam: Blood pressure 116/70, pulse 71, temperature 96.9 F (36.1 C), temperature source Oral, resp. rate 18, height 5\' 6"  (1.676 m), weight 173 lb 11.2 oz (78.79 kg). ECOG: 0 General appearance: alert Head: Normocephalic, without obvious abnormality, atraumatic Neck: no adenopathy, no carotid bruit, no JVD, supple, symmetrical, trachea midline and thyroid not enlarged, symmetric, no tenderness/mass/nodules Lymph nodes: Cervical, supraclavicular, and axillary nodes  normal. Heart:regular rate and rhythm, S1, S2 normal, no murmur, click, rub or gallop Lung:chest clear, no wheezing, rales, normal symmetric air entry Abdomin: soft, non-tender, without masses or organomegaly EXT:no erythema, induration, or nodules   Lab Results: Lab Results  Component Value Date   WBC 4.4 09/14/2013   HGB 13.0 09/14/2013   HCT 38.3 09/14/2013   MCV 89.6 09/14/2013   PLT 248 09/14/2013     Chemistry      Component Value Date/Time   NA 142 09/14/2013 0919   NA 138 03/07/2012 1112   NA 142 09/21/2011 0931   K 4.1 09/14/2013 0919   K 4.4 03/07/2012 1112   K 4.1 09/21/2011 0931   CL 104 09/20/2012 0922   CL 104 03/07/2012 1112   CL 102 09/21/2011 0931   CO2 27 09/14/2013 0919   CO2 29 03/07/2012 1112   CO2 29 09/21/2011 0931   BUN 14.5 09/14/2013 0919   BUN 15 03/07/2012 1112   BUN 17 09/21/2011 0931   CREATININE 0.8 09/14/2013 0919   CREATININE 0.83 03/07/2012 1112   CREATININE 0.9 09/21/2011 0931      Component Value Date/Time   CALCIUM 9.8 09/14/2013 0919   CALCIUM 9.6 03/07/2012 1112   CALCIUM 9.2 09/21/2011 0931   ALKPHOS 57 09/14/2013 0919   ALKPHOS 58 03/07/2012 1112   ALKPHOS 62 09/21/2011 0931   AST 17 09/14/2013 0919   AST 15 03/07/2012 1112   AST 20 09/21/2011 0931   ALT 15 09/14/2013 0919   ALT 14 03/07/2012 1112   ALT 19 09/21/2011 0931   BILITOT 0.78 09/14/2013 0919   BILITOT 0.4 03/07/2012 1112   BILITOT 0.80 09/21/2011 0931     EXAM:  CT CHEST, ABDOMEN, AND PELVIS WITH CONTRAST  TECHNIQUE:  Multidetector CT imaging of the chest, abdomen and pelvis was  performed following the standard protocol during bolus  administration of intravenous contrast.  CONTRAST: 18mL OMNIPAQUE IOHEXOL 300 MG/ML SOLN  COMPARISON: 09/20/2012  FINDINGS:  CT CHEST FINDINGS  No evidence of mediastinal or hilar masses. No adenopathy seen  elsewhere within the thorax. No evidence of pleural or pericardial  effusion.  No suspicious pulmonary nodules or masses are identified. No  evidence of  pulmonary infiltrate or central endobronchial lesion. No  evidence of chest wall mass or suspicious bone lesions.  CT ABDOMEN AND PELVIS FINDINGS  The liver, gallbladder, pancreas, spleen, adrenal glands, and  kidneys are normal in appearance. No evidence hydronephrosis. No  soft tissue masses or lymphadenopathy identified within the abdomen  or pelvis.  Uterus and adnexal regions are unremarkable in appearance. Mild  presacral soft tissue density is stable and consistent with post  treatment changes. 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 No evidence of bowel obstruction or  ischemia.  No evidence of inflammatory process or abnormal fluid collections  within the abdomen or pelvis. No suspicious bone lesions identified.  IMPRESSION:  Stable exam. No  evidence of recurrent carcinoma or metastatic  disease within the chest, abdomen, or pelvis.  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  No acute findings.  Impression and Plan: 58 year old woman with:  1. T3N0 rectal cancer diagnosed in 2011. She is status post abdominoperineal resection for moderately differentiated mucinous  adenocarcinoma on May 01, 2010. She is status post neoadjuvant 5-FU with radiation from 01/27/2010 to 03/16/2010. She began Xeloda in the adjuvant setting from July 02, 2010, to October 18, 2010. She received a total of 6 cycles. She is on active follow up at this time. Her CT scan from 09/14/2013 was discussed today and did not show any disease.  The plan is continue follow up in 6 months and repeat CT scan at 12 months. I will suspend imaging studies after 5 years of remission.  2. bronchitis: Her examination did not suggest that today nor she had any signs of symptoms of 2  3. Colonoscopy follow up: She had one in 2014 a likely repeated in 2-3 years.       [facility],  MD 2/5/20158:48 AM

## 2013-09-21 NOTE — Telephone Encounter (Signed)
lvm for pt regarding to Aug appt....mailed pt appt sched ///avs and letter °

## 2013-09-22 ENCOUNTER — Telehealth: Payer: Self-pay | Admitting: *Deleted

## 2013-09-22 NOTE — Telephone Encounter (Signed)
Pt called to change her appt on 03/21/14 to 04/12/14 w/ labs@ 8am and ov@ 8:30am....td

## 2013-12-03 ENCOUNTER — Other Ambulatory Visit: Payer: Self-pay | Admitting: Cardiology

## 2014-03-21 ENCOUNTER — Ambulatory Visit: Payer: BC Managed Care – PPO | Admitting: Oncology

## 2014-03-21 ENCOUNTER — Other Ambulatory Visit: Payer: BC Managed Care – PPO

## 2014-04-12 ENCOUNTER — Telehealth: Payer: Self-pay | Admitting: Oncology

## 2014-04-12 ENCOUNTER — Encounter: Payer: Self-pay | Admitting: Oncology

## 2014-04-12 ENCOUNTER — Other Ambulatory Visit (HOSPITAL_BASED_OUTPATIENT_CLINIC_OR_DEPARTMENT_OTHER): Payer: BC Managed Care – PPO

## 2014-04-12 ENCOUNTER — Ambulatory Visit (HOSPITAL_BASED_OUTPATIENT_CLINIC_OR_DEPARTMENT_OTHER): Payer: BC Managed Care – PPO | Admitting: Oncology

## 2014-04-12 VITALS — BP 117/73 | HR 78 | Temp 97.6°F | Resp 18 | Ht 66.0 in | Wt 174.6 lb

## 2014-04-12 DIAGNOSIS — C20 Malignant neoplasm of rectum: Secondary | ICD-10-CM

## 2014-04-12 DIAGNOSIS — C189 Malignant neoplasm of colon, unspecified: Secondary | ICD-10-CM

## 2014-04-12 LAB — COMPREHENSIVE METABOLIC PANEL (CC13)
ALT: 15 U/L (ref 0–55)
AST: 16 U/L (ref 5–34)
Albumin: 3.8 g/dL (ref 3.5–5.0)
Alkaline Phosphatase: 51 U/L (ref 40–150)
Anion Gap: 12 mEq/L — ABNORMAL HIGH (ref 3–11)
BUN: 16 mg/dL (ref 7.0–26.0)
CO2: 25 meq/L (ref 22–29)
CREATININE: 0.8 mg/dL (ref 0.6–1.1)
Calcium: 9.6 mg/dL (ref 8.4–10.4)
Chloride: 106 mEq/L (ref 98–109)
Glucose: 77 mg/dl (ref 70–140)
Potassium: 4 mEq/L (ref 3.5–5.1)
Sodium: 143 mEq/L (ref 136–145)
Total Bilirubin: 0.6 mg/dL (ref 0.20–1.20)
Total Protein: 6.9 g/dL (ref 6.4–8.3)

## 2014-04-12 LAB — CBC WITH DIFFERENTIAL/PLATELET
BASO%: 0.7 % (ref 0.0–2.0)
Basophils Absolute: 0 10*3/uL (ref 0.0–0.1)
EOS%: 2 % (ref 0.0–7.0)
Eosinophils Absolute: 0.1 10*3/uL (ref 0.0–0.5)
HCT: 36.8 % (ref 34.8–46.6)
HGB: 12.4 g/dL (ref 11.6–15.9)
LYMPH%: 23.7 % (ref 14.0–49.7)
MCH: 30.1 pg (ref 25.1–34.0)
MCHC: 33.7 g/dL (ref 31.5–36.0)
MCV: 89.3 fL (ref 79.5–101.0)
MONO#: 0.5 10*3/uL (ref 0.1–0.9)
MONO%: 10.6 % (ref 0.0–14.0)
NEUT#: 3.2 10*3/uL (ref 1.5–6.5)
NEUT%: 63 % (ref 38.4–76.8)
PLATELETS: 259 10*3/uL (ref 145–400)
RBC: 4.12 10*6/uL (ref 3.70–5.45)
RDW: 12.9 % (ref 11.2–14.5)
WBC: 5.1 10*3/uL (ref 3.9–10.3)
lymph#: 1.2 10*3/uL (ref 0.9–3.3)

## 2014-04-12 LAB — CEA

## 2014-04-12 NOTE — Progress Notes (Signed)
Hematology and Oncology Follow Up Visit  Colleen Kirby [id number] 1956/05/17 58 y.o. 04/12/2014 8:57 AM Colleen Kirby, MDBarnes, Colleen Kirby*   Principle Diagnosis: 58 year old with T3N0 rectal cancer diagnosed in 04/2010.    Prior Therapy: 1. She is status post neoadjuvant 5- FU with radiation from 01/27/2010 to 03/16/2010 receiving a total of 50.4 Gy with continuous infusion 5-FU. 2. status  post abdominoperineal resection for moderately differentiated mucinous adenocarcinoma on May 01, 2010. Tumor was downgraded to T1 with pathology revealing lakes of mucin with sparse glandular epithelium in the submucosa with no evidence of LV invasion. None of the 10 lymph  nodes sampled had evidence of malignancy. 3. She began Xeloda in the adjuvant setting from July 02, 2010, to October 18, 2010. She received a total of 6 cycles.   Current therapy: Observation and follow up.   Interim History:  Colleen Kirby presents today for a follow up visit with her husband. Since the last visit, she states that she is feeling well. She reports occasional wheezing when lying flat at night trying to sleep which have persisted. She denied shortness of breath or fevers. The patient states that she has occasional headaches and her tailbone has aches/pains that are worse at night. The patient denies any other symptomatolgy such as GI or bowel changes. She has not reported any hematochezia or melena. Has not reported any major changes in her performance status and activity level. She does not report any headaches or syncope. She does report any chest pain or palpitation. He does not report any nausea or vomiting. His Mediport any urinary symptoms. Rest of her review of systems unremarkable.   Medications: I have reviewed the patient's current medications. Current Outpatient Prescriptions  Medication Sig Dispense Refill  . albuterol (PROVENTIL HFA;VENTOLIN HFA) 108 (90 BASE) MCG/ACT inhaler Inhale  2 puffs into the lungs every 6 (six) hours as needed. Wheezing      . Alpha-D-Galactosidase (BEANO PO) Take by mouth as needed.      Marland Kitchen aspirin 162 MG EC tablet Take 162 mg by mouth daily.      . Calcium Citrate-Vitamin D (CITRACAL + D PO) Take 800 mg by mouth daily. Vitamin c 1200mg , vitamin d 2000 units      . cycloSPORINE (RESTASIS) 0.05 % ophthalmic emulsion 1 drop every evening.      Marland Kitchen estradiol (ESTRACE) 0.1 MG/GM vaginal cream Place 2 g vaginally. 2 times weekly      . hydrOXYzine (ATARAX/VISTARIL) 10 MG tablet Take 10 mg by mouth at bedtime as needed for itching. Patient takes at bedtime      . ibuprofen (ADVIL,MOTRIN) 200 MG tablet Take 200-400 mg by mouth every 6 (six) hours as needed. Pain      . metoprolol succinate (TOPROL-XL) 12.5 mg TB24 Take 12.5 mg by mouth daily.      . Omega-3 Fatty Acids (OMEGA 3 PO) Take 1,400 mg by mouth daily.      . pantoprazole (PROTONIX) 40 MG tablet TAKE 1 TABLET BY MOUTH DAILY  30 tablet  5  . Probiotic Product (ALIGN PO) Take by mouth. 1 tab daily        No current facility-administered medications for this visit.    Allergies:  Allergies  Allergen Reactions  . Adhesive [Tape] Itching and Rash    Past Medical History, Surgical history, Social history, and Family History were reviewed and updated.   Physical Exam: Blood pressure 117/73, pulse 78, temperature 97.6 F (36.4 C), temperature  source Oral, resp. rate 18, height 5\' 6"  (1.676 m), weight 174 lb 9.6 oz (79.198 kg). ECOG: 0 General appearance: alert Head: Normocephalic, without obvious abnormality Neck: no adenopathy Lymph nodes: Cervical, supraclavicular, and axillary nodes normal. Heart:regular rate and rhythm, S1, S2 normal, no murmur, click, rub or gallop Lung:chest clear, no wheezing, rales, normal symmetric air entry Abdomin: soft, non-tender, without masses or organomegaly EXT:no erythema, induration, or nodules   Lab Results: Lab Results  Component Value Date   WBC  5.1 04/12/2014   HGB 12.4 04/12/2014   HCT 36.8 04/12/2014   MCV 89.3 04/12/2014   PLT 259 04/12/2014     Chemistry      Component Value Date/Time   NA 142 09/14/2013 0919   NA 138 03/07/2012 1112   NA 142 09/21/2011 0931   K 4.1 09/14/2013 0919   K 4.4 03/07/2012 1112   K 4.1 09/21/2011 0931   CL 104 09/20/2012 0922   CL 104 03/07/2012 1112   CL 102 09/21/2011 0931   CO2 27 09/14/2013 0919   CO2 29 03/07/2012 1112   CO2 29 09/21/2011 0931   BUN 14.5 09/14/2013 0919   BUN 15 03/07/2012 1112   BUN 17 09/21/2011 0931   CREATININE 0.8 09/14/2013 0919   CREATININE 0.83 03/07/2012 1112   CREATININE 0.9 09/21/2011 0931      Component Value Date/Time   CALCIUM 9.8 09/14/2013 0919   CALCIUM 9.6 03/07/2012 1112   CALCIUM 9.2 09/21/2011 0931   ALKPHOS 57 09/14/2013 0919   ALKPHOS 58 03/07/2012 1112   ALKPHOS 62 09/21/2011 0931   AST 17 09/14/2013 0919   AST 15 03/07/2012 1112   AST 20 09/21/2011 0931   ALT 15 09/14/2013 0919   ALT 14 03/07/2012 1112   ALT 19 09/21/2011 0931   BILITOT 0.78 09/14/2013 0919   BILITOT 0.4 03/07/2012 1112   BILITOT 0.80 09/21/2011 0931      Impression and Plan: 58 year old woman with:  1. T3N0 rectal cancer diagnosed in 2011. She is status post abdominoperineal resection for moderately differentiated mucinous  adenocarcinoma on May 01, 2010. She is status post neoadjuvant 5-FU with radiation from 01/27/2010 to 03/16/2010. She began Xeloda in the adjuvant setting from July 02, 2010, to October 18, 2010. She received a total of 6 cycles. She is on active follow up at this time. Her CT scan from 09/14/2013  did not show any disease. The plan is continue follow up in 6 months and repeat CT scan at that time.  2. Bronchitis: Looks to be improving at this time. She did not have any wheezing on her examination today.  3. Colonoscopy follow up: She had one in 2014 a likely repeated in 2-3 years.       [facility], MD 8/27/20158:57 AM

## 2014-04-12 NOTE — Telephone Encounter (Signed)
Pt confirmed labs/ov per 08/27 POF, gave pt AVS...KJ °

## 2014-06-01 NOTE — Telephone Encounter (Signed)
Nothing was typed/tmj 

## 2014-06-02 ENCOUNTER — Other Ambulatory Visit: Payer: Self-pay | Admitting: Cardiology

## 2014-06-02 NOTE — Telephone Encounter (Signed)
Per office visit (from 12/2012) patient was to follow-up with Dr Aundra Dubin on an as needed basis. Request refused.

## 2014-06-04 ENCOUNTER — Other Ambulatory Visit: Payer: Self-pay | Admitting: Cardiology

## 2014-07-24 ENCOUNTER — Other Ambulatory Visit: Payer: Self-pay

## 2014-07-24 DIAGNOSIS — Z1231 Encounter for screening mammogram for malignant neoplasm of breast: Secondary | ICD-10-CM

## 2014-08-03 ENCOUNTER — Other Ambulatory Visit: Payer: Self-pay

## 2014-08-03 MED ORDER — PANTOPRAZOLE SODIUM 40 MG PO TBEC: 40.0000 mg | DELAYED_RELEASE_TABLET | Freq: Every day | ORAL | Status: DC

## 2014-08-23 ENCOUNTER — Ambulatory Visit
Admission: RE | Admit: 2014-08-23 | Discharge: 2014-08-23 | Disposition: A | Discharge status: Home / Self Care | Payer: BLUE CROSS/BLUE SHIELD | Source: Ambulatory Visit

## 2014-08-23 DIAGNOSIS — Z1231 Encounter for screening mammogram for malignant neoplasm of breast: Secondary | ICD-10-CM

## 2014-09-05 ENCOUNTER — Other Ambulatory Visit: Payer: Self-pay | Admitting: Cardiology

## 2014-10-08 ENCOUNTER — Other Ambulatory Visit (HOSPITAL_BASED_OUTPATIENT_CLINIC_OR_DEPARTMENT_OTHER): Payer: BLUE CROSS/BLUE SHIELD

## 2014-10-08 ENCOUNTER — Ambulatory Visit (HOSPITAL_COMMUNITY)
Admission: RE | Admit: 2014-10-08 | Discharge: 2014-10-08 | Disposition: A | Discharge status: Home / Self Care | Payer: BLUE CROSS/BLUE SHIELD | Source: Ambulatory Visit | Attending: Oncology | Admitting: Oncology

## 2014-10-08 ENCOUNTER — Encounter (HOSPITAL_COMMUNITY): Payer: Self-pay

## 2014-10-08 DIAGNOSIS — C20 Malignant neoplasm of rectum: Secondary | ICD-10-CM | POA: Diagnosis present

## 2014-10-08 DIAGNOSIS — C189 Malignant neoplasm of colon, unspecified: Secondary | ICD-10-CM

## 2014-10-08 DIAGNOSIS — Z923 Personal history of irradiation: Secondary | ICD-10-CM | POA: Insufficient documentation

## 2014-10-08 DIAGNOSIS — Z9221 Personal history of antineoplastic chemotherapy: Secondary | ICD-10-CM | POA: Diagnosis not present

## 2014-10-08 LAB — COMPREHENSIVE METABOLIC PANEL (CC13)
ALT: 10 U/L (ref 0–55)
ANION GAP: 9 meq/L (ref 3–11)
AST: 14 U/L (ref 5–34)
Albumin: 4.1 g/dL (ref 3.5–5.0)
Alkaline Phosphatase: 49 U/L (ref 40–150)
BUN: 15.3 mg/dL (ref 7.0–26.0)
CO2: 27 meq/L (ref 22–29)
Calcium: 9.8 mg/dL (ref 8.4–10.4)
Chloride: 105 mEq/L (ref 98–109)
Creatinine: 0.9 mg/dL (ref 0.6–1.1)
EGFR: 72 mL/min/{1.73_m2} — AB (ref >90)
Glucose: 89 mg/dl (ref 70–140)
POTASSIUM: 4.4 meq/L (ref 3.5–5.1)
SODIUM: 142 meq/L (ref 136–145)
Total Bilirubin: 0.52 mg/dL (ref 0.20–1.20)
Total Protein: 7.1 g/dL (ref 6.4–8.3)

## 2014-10-08 LAB — CBC WITH DIFFERENTIAL/PLATELET
BASO%: 0.6 % (ref 0.0–2.0)
Basophils Absolute: 0 10*3/uL (ref 0.0–0.1)
EOS ABS: 0.1 10*3/uL (ref 0.0–0.5)
EOS%: 1.8 % (ref 0.0–7.0)
HEMATOCRIT: 38.9 % (ref 34.8–46.6)
HEMOGLOBIN: 12.8 g/dL (ref 11.6–15.9)
LYMPH%: 26 % (ref 14.0–49.7)
MCH: 29.3 pg (ref 25.1–34.0)
MCHC: 32.9 g/dL (ref 31.5–36.0)
MCV: 89 fL (ref 79.5–101.0)
MONO#: 0.4 10*3/uL (ref 0.1–0.9)
MONO%: 9.9 % (ref 0.0–14.0)
NEUT#: 2.6 10*3/uL (ref 1.5–6.5)
NEUT%: 61.7 % (ref 38.4–76.8)
Platelets: 273 10*3/uL (ref 145–400)
RBC: 4.38 10*6/uL (ref 3.70–5.45)
RDW: 12.6 % (ref 11.2–14.5)
WBC: 4.2 10*3/uL (ref 3.9–10.3)
lymph#: 1.1 10*3/uL (ref 0.9–3.3)

## 2014-10-08 LAB — CEA

## 2014-10-08 MED ORDER — IOHEXOL 300 MG/ML  SOLN
100.0000 mL | Freq: Once | INTRAMUSCULAR | Status: AC | PRN
  Administered 2014-10-08: 100 mL via INTRAVENOUS

## 2014-10-12 ENCOUNTER — Telehealth: Payer: Self-pay | Admitting: Oncology

## 2014-10-12 ENCOUNTER — Ambulatory Visit (HOSPITAL_BASED_OUTPATIENT_CLINIC_OR_DEPARTMENT_OTHER): Payer: BLUE CROSS/BLUE SHIELD | Admitting: Oncology

## 2014-10-12 VITALS — BP 118/72 | HR 73 | Temp 97.5°F | Resp 18 | Ht 66.0 in | Wt 173.1 lb

## 2014-10-12 DIAGNOSIS — C20 Malignant neoplasm of rectum: Secondary | ICD-10-CM

## 2014-10-12 DIAGNOSIS — J40 Bronchitis, not specified as acute or chronic: Secondary | ICD-10-CM

## 2014-10-12 DIAGNOSIS — C189 Malignant neoplasm of colon, unspecified: Secondary | ICD-10-CM

## 2014-10-12 NOTE — Telephone Encounter (Signed)
Pt confirmed labs/ov per 02/26 POF, gave pt AVS..... KJ °

## 2014-10-12 NOTE — Progress Notes (Signed)
Hematology and Oncology Follow Up Visit  Colleen Kirby [id number] 02-Oct-1955 59 y.o. 10/12/2014 8:45 AM [name], MDBarnes, [name]   Principle Diagnosis: 59 year old with T3N0 rectal cancer diagnosed in 04/2010.    Prior Therapy: 1. She is status post neoadjuvant 5- FU with radiation from 01/27/2010 to 03/16/2010 receiving a total of 50.4 Gy with continuous infusion 5-FU. 2. status post abdominoperineal resection for moderately differentiated mucinous adenocarcinoma on May 01, 2010. Tumor was downgraded to T1 with pathology revealing lakes of mucin with sparse glandular epithelium in the submucosa with no evidence of LV invasion. None of the 10 lymph  nodes sampled had evidence of malignancy. 3. She began Xeloda in the adjuvant setting from July 02, 2010, to October 18, 2010. She received a total of 6 cycles.   Current therapy: Observation and follow up.   Interim History:  [name] presents today for a follow up visit with her husband. Since the last visit, she reports no complaints. She reports occasional clicking noise when she is taking a deep breath while when  flat at night trying to sleep which have persisted. She denied shortness of breath or fevers. She denies any other symptomatolgy such as GI or bowel changes. She has not reported any hematochezia or melena. She does not report any nausea, vomiting, abdominal distention or early satiety. Has not reported any major changes in her performance status and activity level. She does not report any headaches or syncope. She does report any chest pain or palpitation. She reports no urinary symptoms. Rest of her review of systems unremarkable.   Medications: I have reviewed the patient's current medications. Current Outpatient Prescriptions  Medication Sig Dispense Refill  . albuterol (PROVENTIL HFA;VENTOLIN HFA) 108 (90 BASE) MCG/ACT inhaler Inhale 2 puffs into the lungs every 6 (six) hours as needed.  Wheezing    . Alpha-D-Galactosidase (BEANO PO) Take by mouth as needed. beano    . aspirin 162 MG EC tablet Take 162 mg by mouth daily.    . Calcium Citrate-Vitamin D (CITRACAL + D PO) Take 800 mg by mouth daily. Vitamin c 1200mg , vitamin d 2000 units    . cycloSPORINE (RESTASIS) 0.05 % ophthalmic emulsion 1 drop every evening.    Marland Kitchen estradiol (ESTRACE) 0.1 MG/GM vaginal cream Place 2 g vaginally. 2 times weekly    . hydrOXYzine (ATARAX/VISTARIL) 10 MG tablet Take 10 mg by mouth at bedtime as needed for itching. Patient takes at bedtime    . ibuprofen (ADVIL,MOTRIN) 200 MG tablet Take 200-400 mg by mouth every 6 (six) hours as needed. Pain    . Meth-Hyo-M Bl-Na Phos-Ph Sal (URIBEL) 118 MG CAPS Take 1 tablet by mouth 3 (three) times daily.  3  . metoprolol succinate (TOPROL-XL) 25 MG 24 hr tablet TAKE 1/2 TABLET BY MOUTH DAILY 15 tablet 5  . Omega-3 Fatty Acids (OMEGA 3 PO) Take 1,400 mg by mouth daily.    . pantoprazole (PROTONIX) 40 MG tablet Take 1 tablet (40 mg total) by mouth daily. 90 tablet 1  . Probiotic Product (ALIGN PO) Take by mouth. 1 tab daily      No current facility-administered medications for this visit.    Allergies:  Allergies  Allergen Reactions  . Adhesive [Tape] Itching and Rash    Past Medical History, Surgical history, Social history, and Family History were reviewed and updated.   Physical Exam: Blood pressure 118/72, pulse 73, temperature 97.5 F (36.4 C), temperature source Oral, resp. rate 18, height  5\' 6"  (1.676 m), weight 173 lb 1.6 oz (78.518 kg), SpO2 99 %. ECOG: 0 General appearance: alert, awake NAD.  Head: Normocephalic, without obvious abnormality Neck: no adenopathy Lymph nodes: Cervical, supraclavicular, and axillary nodes normal. Heart:regular rate and rhythm, S1, S2 normal, no murmur, click, rub or gallop Lung:chest clear, no wheezing, rales, normal symmetric air entry Abdomin: soft, non-tender, without masses or organomegaly EXT:no  erythema, induration, or nodules   Lab Results: Lab Results  Component Value Date   WBC 4.2 10/08/2014   HGB 12.8 10/08/2014   HCT 38.9 10/08/2014   MCV 89.0 10/08/2014   PLT 273 10/08/2014     Chemistry      Component Value Date/Time   NA 142 10/08/2014 0834   NA 138 03/07/2012 1112   NA 142 09/21/2011 0931   K 4.4 10/08/2014 0834   K 4.4 03/07/2012 1112   K 4.1 09/21/2011 0931   CL 104 09/20/2012 0922   CL 104 03/07/2012 1112   CL 102 09/21/2011 0931   CO2 27 10/08/2014 0834   CO2 29 03/07/2012 1112   CO2 29 09/21/2011 0931   BUN 15.3 10/08/2014 0834   BUN 15 03/07/2012 1112   BUN 17 09/21/2011 0931   CREATININE 0.9 10/08/2014 0834   CREATININE 0.83 03/07/2012 1112   CREATININE 0.9 09/21/2011 0931      Component Value Date/Time   CALCIUM 9.8 10/08/2014 0834   CALCIUM 9.6 03/07/2012 1112   CALCIUM 9.2 09/21/2011 0931   ALKPHOS 49 10/08/2014 0834   ALKPHOS 58 03/07/2012 1112   ALKPHOS 62 09/21/2011 0931   AST 14 10/08/2014 0834   AST 15 03/07/2012 1112   AST 20 09/21/2011 0931   ALT 10 10/08/2014 0834   ALT 14 03/07/2012 1112   ALT 19 09/21/2011 0931   BILITOT 0.52 10/08/2014 0834   BILITOT 0.4 03/07/2012 1112   BILITOT 0.80 09/21/2011 0931      EXAM: CT CHEST, ABDOMEN, AND PELVIS WITH CONTRAST  TECHNIQUE: Multidetector CT imaging of the chest, abdomen and pelvis was performed following the standard protocol during bolus administration of intravenous contrast.  CONTRAST: 171mL OMNIPAQUE IOHEXOL 300 MG/ML SOLN  COMPARISON: 09/14/2013.  FINDINGS: CT CHEST FINDINGS  CT CHEST FINDINGS  Mediastinum/Nodes: No pathologically enlarged mediastinal, hilar or axillary lymph nodes. Heart size normal. No pericardial effusion. Tiny hiatal hernia.  Lungs/Pleura: Lungs are clear. No pleural fluid. Airway is unremarkable.  Musculoskeletal: No worrisome lytic or sclerotic lesions.  CT ABDOMEN AND PELVIS FINDINGS  Hepatobiliary: Liver and  gallbladder are unremarkable. No biliary ductal dilatation.  Pancreas: Negative.  Spleen: Negative.  Adrenals/Urinary Tract: Adrenal glands and kidneys are unremarkable. Ureters are decompressed. Bladder is low in volume.  Stomach/Bowel: Stomach is otherwise unremarkable. Unobstructed small bowel is seen within a left lower quadrant parastomal hernia. Left lower quadrant colostomy with rectal resection.  Vascular/Lymphatic: Vascular structures are unremarkable. No pathologically enlarged lymph nodes.  Reproductive: Uterus and ovaries are visualized in the presacral space. Minimal presacral thickening, stable.  Other: No free fluid. Mesenteries and peritoneum are otherwise unremarkable. Tiny periumbilical hernia contains fat. Left lower quadrant parastomal hernia is mentioned above.  Musculoskeletal: No worrisome lytic or sclerotic lesions.  IMPRESSION: 1. Postoperative changes of rectal resection and left lower quadrant colostomy without evidence of recurrent or metastatic disease. 2. Left lower quadrant parastomal hernia containing unobstructed small bowel, as before.    Impression and Plan:  59 year old woman with:  1. T3N0 rectal cancer diagnosed in 2011. She is status post  abdominoperineal resection for moderately differentiated mucinous adenocarcinoma on May 01, 2010. She is status post neoadjuvant 5-FU with radiation from 01/27/2010 to 03/16/2010. She began Xeloda in the adjuvant setting from July 02, 2010, to October 18, 2010. She received a total of 6 cycles. She is on active follow up at this time. Her CT scan from 10/08/2014 was discussed today and  did not show any disease. The plan is continue follow up in 6 months and repeat CT scan in 12 months. She will likely need no further imaging beyond January 2017.  2. Bronchitis: Looks to be improving at this time. She did not have any wheezing on her examination today.  3. Colonoscopy follow up: She  had one in 2014 a likely repeated in 2-3 years.    [facility], MD 2/26/20168:45 AM

## 2014-10-29 ENCOUNTER — Other Ambulatory Visit: Payer: Self-pay | Admitting: Cardiology

## 2014-12-07 ENCOUNTER — Ambulatory Visit (INDEPENDENT_AMBULATORY_CARE_PROVIDER_SITE_OTHER): Payer: BLUE CROSS/BLUE SHIELD | Admitting: Cardiology

## 2014-12-07 ENCOUNTER — Encounter: Payer: Self-pay | Admitting: Cardiology

## 2014-12-07 VITALS — BP 118/70 | HR 78 | Ht 66.0 in | Wt 168.8 lb

## 2014-12-07 DIAGNOSIS — R002 Palpitations: Secondary | ICD-10-CM

## 2014-12-07 MED ORDER — PANTOPRAZOLE SODIUM 40 MG PO TBEC: 40.0000 mg | DELAYED_RELEASE_TABLET | Freq: Every day | ORAL | Status: DC

## 2014-12-07 MED ORDER — NEBIVOLOL HCL 2.5 MG PO TABS: 2.5000 mg | ORAL_TABLET | Freq: Every day | ORAL | Status: DC

## 2014-12-07 NOTE — Progress Notes (Signed)
Cardiology Office Note   Date:  12/07/2014   ID:  Colleen Kirby, DOB [birth year], MRN [id number]  PCP:  [name], MD  Cardiologist:  Dr. Aundra Dubin    Chief Complaint  Patient presents with  . Tachycardia    no new episodes on meds.       History of Present Illness: Colleen Kirby is a 59 y.o. female who presents for follow and to re-order meds.    Occ. palpitations no long runs of tachycardia.  She was reassured about the palpitations.   She has 2 concerns 1 the metoprolol she feels is drying her eyes at. I explained this be very rare side effect but she thinks this is the medicine is causing the dryness which is increased over the last 2 years. We discussed changing to another beta blocker to see if that will make any difference- will change bystolic  2.5 milligrams daily.  We are also following her Protonix.  She does have a GI physician Dr. Earlean Shawl and she will follow-up with Dr. Earlean Shawl to decide if she should remain on the Protonix versus weaning off I will refill that for now tthough.    Past Medical History  Diagnosis Date  . GERD (gastroesophageal reflux disease)   . Atypical chest pain   . Palpitations   . History of DVT (deep vein thrombosis)   . Rectal cancer     No past surgical history on file.   Current Outpatient Prescriptions  Medication Sig Dispense Refill  . albuterol (PROVENTIL HFA;VENTOLIN HFA) 108 (90 BASE) MCG/ACT inhaler Inhale 2 puffs into the lungs every 6 (six) hours as needed. Wheezing    . Alpha-D-Galactosidase (BEANO PO) Take 1 tablet by mouth as needed (GAS). beano    . aspirin EC 81 MG tablet Take 81 mg by mouth daily.    . Calcium Citrate-Vitamin D (CITRACAL + D PO) Take 800 mg by mouth daily. Vitamin c 1200mg , vitamin d 2000 units    . Carboxymethylcell-Hypromellose 0.25-0.3 % GEL Place 1 application into both eyes at bedtime.    . cycloSPORINE (RESTASIS) 0.05 % ophthalmic emulsion Place 1 drop into both eyes 2 (two)  times daily.     Marland Kitchen estradiol (ESTRACE) 0.1 MG/GM vaginal cream Place 2 g vaginally. 2 times weekly    . hydrOXYzine (ATARAX/VISTARIL) 10 MG tablet Take 10 mg by mouth at bedtime. Patient takes at bedtime    . ibuprofen (ADVIL,MOTRIN) 200 MG tablet Take 200-400 mg by mouth every 6 (six) hours as needed. Pain    . Meth-Hyo-M Bl-Na Phos-Ph Sal (URIBEL) 118 MG CAPS Take 1 tablet by mouth as needed (BLADDER PAIN).   3  . Omega-3 Fatty Acids (OMEGA 3 PO) Take 1,400 mg by mouth daily.    . pantoprazole (PROTONIX) 40 MG tablet Take 1 tablet (40 mg total) by mouth daily. 90 tablet 3  . Probiotic Product (ALIGN PO) Take 1 tablet by mouth daily. 1 tab daily    . nebivolol (BYSTOLIC) 2.5 MG tablet Take 1 tablet (2.5 mg total) by mouth daily. 30 tablet 6   No current facility-administered medications for this visit.    Allergies:   Adhesive    Social History:  The patient  reports that she has never smoked. She does not have any smokeless tobacco history on file. She reports that she does not drink alcohol.   Family History:  The patient's family history includes Arrhythmia in an other family member; Cancer in her mother; Congestive  Heart Failure in her mother; Coronary artery disease in an other family member; Healthy in her brother; Heart disease in her father; Hypertension in her brother and mother.    ROS:  General:no colds or fevers, no weight changes Skin:no rashes or ulcers HEENT:no blurred vision, no congestion CV:see HPI PUL:see HPI GI:no diarrhea constipation or melena, no indigestion- hx of GERD GU:no hematuria, no dysuria MS:no joint pain, no claudication Neuro:no syncope, no lightheadedness Endo:no diabetes, no thyroid disease  Wt Readings from Last 3 Encounters:  12/07/14 168 lb 12.8 oz (76.567 kg)  10/12/14 173 lb 1.6 oz (78.518 kg)  04/12/14 174 lb 9.6 oz (79.198 kg)     PHYSICAL EXAM: VS:  BP 118/70 mmHg  Pulse 78  Ht 5\' 6"  (1.676 m)  Wt 168 lb 12.8 oz (76.567 kg)  BMI  27.26 kg/m2  SpO2 98% , BMI Body mass index is 27.26 kg/(m^2). General:Pleasant affect, NAD Skin:Warm and dry, brisk capillary refill HEENT:normocephalic, sclera clear, mucus membranes moist Neck:supple, no JVD, no bruits  Heart:S1S2 RRR without murmur, gallup, rub or click Lungs:clear without rales, rhonchi, or wheezes JSE:GBTD, non tender, + BS, do not palpate liver spleen or masses Ext:no lower ext edema, 2+ pedal pulses, 2+ radial pulses Neuro:alert and oriented, MAE, follows commands, + facial symmetry    EKG:  EKG is NOT ordered today.    Recent Labs: 10/08/2014: ALT 10; BUN 15.3; Creatinine 0.9; Hemoglobin 12.8; Platelets 273; Potassium 4.4; Sodium 142    Lipid Panel No results found for: CHOL, TRIG, HDL, CHOLHDL, VLDL, LDLCALC, LDLDIRECT     Other studies Reviewed: Additional studies/ records that were reviewed today include: previous notes.   ASSESSMENT AND PLAN:  1.  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  She will follow up with Dr. Aundra Dubin in 1 year.  She will call if increased palpitations.   2. Dry eyes, doubt from BB but will change to another BB to see if any difference.  She will need to follow up with opthalmology.    3. GERD- we ordered protonix after admit for chest pain, I refilled but she will follow up with Dr. Earlean Shawl to discuss stopping    Current medicines are reviewed with the patient today.  The patient Has no concerns regarding medicines.  The following changes have been made:  See above Labs/ tests ordered today include:see above  Disposition:   FU:  see above  Lennie Muckle, NP  12/07/2014 4:58 PM    [facility] [place], [place], [place], [place] Phone: [phone]; Fax: [phone]

## 2014-12-07 NOTE — Patient Instructions (Signed)
Medication Instructions:  STOP METOPROLOL  START BYSTOLIC 2.5 MG ONCE  DAY   REFILL FOR PROTONIX SENT INTO [facility] IN [place]  Labwork: NONE TODAY   Testing/Procedures: NONE TODAY   Follow-Up: Your physician wants you to follow-up in: [place] will receive a reminder letter in the mail two months in advance. If you don't receive a letter, please call our office to schedule the follow-up appointment.   Any Other Special Instructions Will Be Listed Below (If Applicable).

## 2015-01-10 ENCOUNTER — Telehealth: Payer: Self-pay | Admitting: Cardiology

## 2015-01-10 MED ORDER — METOPROLOL SUCCINATE ER 25 MG PO TB24: 12.5000 mg | ORAL_TABLET | Freq: Every evening | ORAL | Status: DC

## 2015-01-10 NOTE — Telephone Encounter (Signed)
Fine to switch back.  Thanks.

## 2015-01-10 NOTE — Telephone Encounter (Signed)
Pt said she saw Mickel Baas over a month ago and she told her to call back,concerning her Bystolic.

## 2015-01-10 NOTE — Telephone Encounter (Signed)
Pt informed me she was doing about the same on the Bystolic - had switched back to taking the metoprolol and wanted to make sure this was OK - she had started Bystolic for concern for SE of dry eyes w/ metoprolol.  I explained unknown if this would be an issue w/ the bystolic or not. Patient also states she thinks she was doing better w/ control of nighttime palpitations while on the metoprolol.  Informed her the switch back was fine, would route to Mickel Baas so she was aware. Updated med list.

## 2015-04-01 ENCOUNTER — Other Ambulatory Visit: Payer: Self-pay | Admitting: Cardiology

## 2015-04-02 ENCOUNTER — Other Ambulatory Visit: Payer: Self-pay

## 2015-04-02 NOTE — Telephone Encounter (Signed)
Approved      Disp Refills Start End    metoprolol succinate (TOPROL XL) 25 MG 24 hr tablet 90 tablet 1 01/10/2015     Sig - Route:  Take 0.5 tablets (12.5 mg total) by mouth every evening. - Oral    Class:  Fill Later    Authorizing Provider:  Isaiah Serge, NP    Ordering User:  Theodore Demark, RN

## 2015-04-09 ENCOUNTER — Other Ambulatory Visit: Payer: Self-pay | Admitting: Cardiology

## 2015-04-12 ENCOUNTER — Telehealth: Payer: Self-pay | Admitting: Oncology

## 2015-04-12 ENCOUNTER — Other Ambulatory Visit (HOSPITAL_BASED_OUTPATIENT_CLINIC_OR_DEPARTMENT_OTHER): Payer: BLUE CROSS/BLUE SHIELD

## 2015-04-12 ENCOUNTER — Ambulatory Visit (HOSPITAL_BASED_OUTPATIENT_CLINIC_OR_DEPARTMENT_OTHER): Payer: BLUE CROSS/BLUE SHIELD | Admitting: Oncology

## 2015-04-12 VITALS — BP 111/64 | HR 70 | Temp 98.1°F | Resp 18 | Ht 66.0 in | Wt 163.4 lb

## 2015-04-12 DIAGNOSIS — C20 Malignant neoplasm of rectum: Secondary | ICD-10-CM | POA: Diagnosis not present

## 2015-04-12 DIAGNOSIS — C189 Malignant neoplasm of colon, unspecified: Secondary | ICD-10-CM

## 2015-04-12 LAB — CBC WITH DIFFERENTIAL/PLATELET
BASO%: 0.7 % (ref 0.0–2.0)
Basophils Absolute: 0 10*3/uL (ref 0.0–0.1)
EOS%: 1.9 % (ref 0.0–7.0)
Eosinophils Absolute: 0.1 10*3/uL (ref 0.0–0.5)
HEMATOCRIT: 37.4 % (ref 34.8–46.6)
HEMOGLOBIN: 12.7 g/dL (ref 11.6–15.9)
LYMPH#: 1.2 10*3/uL (ref 0.9–3.3)
LYMPH%: 26.9 % (ref 14.0–49.7)
MCH: 30.3 pg (ref 25.1–34.0)
MCHC: 34 g/dL (ref 31.5–36.0)
MCV: 89.2 fL (ref 79.5–101.0)
MONO#: 0.4 10*3/uL (ref 0.1–0.9)
MONO%: 9.7 % (ref 0.0–14.0)
NEUT%: 60.8 % (ref 38.4–76.8)
NEUTROS ABS: 2.7 10*3/uL (ref 1.5–6.5)
Platelets: 274 10*3/uL (ref 145–400)
RBC: 4.19 10*6/uL (ref 3.70–5.45)
RDW: 12.8 % (ref 11.2–14.5)
WBC: 4.5 10*3/uL (ref 3.9–10.3)

## 2015-04-12 LAB — COMPREHENSIVE METABOLIC PANEL (CC13)
ALT: 10 U/L (ref 0–55)
AST: 13 U/L (ref 5–34)
Albumin: 3.8 g/dL (ref 3.5–5.0)
Alkaline Phosphatase: 51 U/L (ref 40–150)
Anion Gap: 8 mEq/L (ref 3–11)
BUN: 18.6 mg/dL (ref 7.0–26.0)
CALCIUM: 9.6 mg/dL (ref 8.4–10.4)
CHLORIDE: 107 meq/L (ref 98–109)
CO2: 28 mEq/L (ref 22–29)
CREATININE: 1 mg/dL (ref 0.6–1.1)
EGFR: 64 mL/min/{1.73_m2} — ABNORMAL LOW (ref >90)
Glucose: 73 mg/dl (ref 70–140)
Potassium: 4.1 mEq/L (ref 3.5–5.1)
Sodium: 142 mEq/L (ref 136–145)
TOTAL PROTEIN: 6.6 g/dL (ref 6.4–8.3)
Total Bilirubin: 0.72 mg/dL (ref 0.20–1.20)

## 2015-04-12 LAB — CEA: CEA: 25.8 ng/mL — AB (ref 0.0–5.0)

## 2015-04-12 NOTE — Telephone Encounter (Signed)
Pt confirmed labs/ov per 08/26 POF, gave pt AVS and Calendar... KJ °

## 2015-04-12 NOTE — Progress Notes (Signed)
Hematology and Oncology Follow Up Visit  Colleen Kirby [id number] 1956-05-15 59 y.o. 04/12/2015 8:37 AM Colleen Kirby, MDBarnes, Colleen Mola, MD   Principle Diagnosis: 59 year old with T3N0 rectal cancer diagnosed in 04/2010.    Prior Therapy: 1. She is status post neoadjuvant 5- FU with radiation from 01/27/2010 to 03/16/2010 receiving a total of 50.4 Gy with continuous infusion 5-FU. 2. status post abdominoperineal resection for moderately differentiated mucinous adenocarcinoma on May 01, 2010. Tumor was downgraded to T1 with pathology revealing lakes of mucin with sparse glandular epithelium in the submucosa with no evidence of LV invasion. None of the 10 lymph  nodes sampled had evidence of malignancy. 3. She began Xeloda in the adjuvant setting from July 02, 2010, to October 18, 2010. She received a total of 6 cycles.   Current therapy: Observation and follow up.   Interim History:  Colleen Kirby presents today for a follow up visit with her husband. Since the last visit, she continues to do fairly well. She has reported a periodic back pain but not associated with any neurological symptoms. Her pain is nagging and very intermittent. He usually resolves spontaneously. She does not report any abdominal pain. She has not reported any hematochezia or melena. She does not report any nausea, vomiting, abdominal distention or early satiety. Has not reported any major changes in her performance status and activity level.   She does not report any headaches, blurry vision or syncope. She does not report any fevers, chills or sweats. She does report intentional weight loss but no change in her appetite. She does report any chest pain or palpitation. She does not report any cough or wheezing. She reports no urinary symptoms. She does not report any skeletal complaints. Rest of her review of systems unremarkable.   Medications: I have reviewed the patient's current  medications. Current Outpatient Prescriptions  Medication Sig Dispense Refill  . albuterol (PROVENTIL HFA;VENTOLIN HFA) 108 (90 BASE) MCG/ACT inhaler Inhale 2 puffs into the lungs every 6 (six) hours as needed. Wheezing    . Alpha-D-Galactosidase (BEANO PO) Take 1 tablet by mouth as needed (GAS). beano    . aspirin EC 81 MG tablet Take 81 mg by mouth daily.    . Calcium Citrate-Vitamin D (CITRACAL + D PO) Take 800 mg by mouth daily. Vitamin c 1200mg , vitamin d 2000 units    . Carboxymethylcell-Hypromellose 0.25-0.3 % GEL Place 1 application into both eyes at bedtime.    . cycloSPORINE (RESTASIS) 0.05 % ophthalmic emulsion Place 1 drop into both eyes 2 (two) times daily.     Marland Kitchen estradiol (ESTRACE) 0.1 MG/GM vaginal cream Place 2 g vaginally. 2 times weekly    . hydrOXYzine (ATARAX/VISTARIL) 10 MG tablet Take 10 mg by mouth at bedtime. Patient takes at bedtime    . ibuprofen (ADVIL,MOTRIN) 200 MG tablet Take 200-400 mg by mouth every 6 (six) hours as needed. Pain    . metoprolol succinate (TOPROL-XL) 25 MG 24 hr tablet TAKE 1/2 TABLET BY MOUTH DAILY 15 tablet 7  . Omega-3 Fatty Acids (OMEGA 3 PO) Take 1,400 mg by mouth daily.    . pantoprazole (PROTONIX) 40 MG tablet Take 1 tablet (40 mg total) by mouth daily. 90 tablet 3  . Probiotic Product (ALIGN PO) Take 1 tablet by mouth daily. 1 tab daily     No current facility-administered medications for this visit.    Allergies:  Allergies  Allergen Reactions  . Adhesive [Tape] Itching and Rash  Past Medical History, Surgical history, Social history, and Family History were reviewed and updated.   Physical Exam: Blood pressure 111/64, pulse 70, temperature 98.1 F (36.7 C), temperature source Oral, resp. rate 18, height 5\' 6"  (1.676 m), weight 163 lb 6.4 oz (74.118 kg), SpO2 100 %. ECOG: 0 General appearance: alert, awake woman and not in any distress. Head: Normocephalic, without obvious abnormality Neck: no adenopathy Lymph nodes:  Cervical, supraclavicular, and axillary nodes normal. Heart:regular rate and rhythm, S1, S2 normal, no murmur, click, rub or gallop Lung:chest clear, no wheezing, rales, normal symmetric air entry no dullness to percussion. Abdomin: soft, non-tender, without masses or organomegaly no shifting dullness. EXT:no erythema, induration, or nodules   Lab Results: Lab Results  Component Value Date   WBC 4.5 04/12/2015   HGB 12.7 04/12/2015   HCT 37.4 04/12/2015   MCV 89.2 04/12/2015   PLT 274 04/12/2015     Chemistry      Component Value Date/Time   NA 142 10/08/2014 0834   NA 138 03/07/2012 1112   NA 142 09/21/2011 0931   K 4.4 10/08/2014 0834   K 4.4 03/07/2012 1112   K 4.1 09/21/2011 0931   CL 104 09/20/2012 0922   CL 104 03/07/2012 1112   CL 102 09/21/2011 0931   CO2 27 10/08/2014 0834   CO2 29 03/07/2012 1112   CO2 29 09/21/2011 0931   BUN 15.3 10/08/2014 0834   BUN 15 03/07/2012 1112   BUN 17 09/21/2011 0931   CREATININE 0.9 10/08/2014 0834   CREATININE 0.83 03/07/2012 1112   CREATININE 0.9 09/21/2011 0931      Component Value Date/Time   CALCIUM 9.8 10/08/2014 0834   CALCIUM 9.6 03/07/2012 1112   CALCIUM 9.2 09/21/2011 0931   ALKPHOS 49 10/08/2014 0834   ALKPHOS 58 03/07/2012 1112   ALKPHOS 62 09/21/2011 0931   AST 14 10/08/2014 0834   AST 15 03/07/2012 1112   AST 20 09/21/2011 0931   ALT 10 10/08/2014 0834   ALT 14 03/07/2012 1112   ALT 19 09/21/2011 0931   BILITOT 0.52 10/08/2014 0834   BILITOT 0.4 03/07/2012 1112   BILITOT 0.80 09/21/2011 0931     Results for Colleen Kirby (MRN [id number]) as of 04/12/2015 08:25  Ref. Range 09/14/2013 09:19 04/12/2014 08:30 10/08/2014 08:34  CEA Latest Ref Range: 0.0-5.0 ng/mL <0.5 <0.5 <0.5    Impression and Plan:  59 year old woman with:  1. T3N0 rectal cancer diagnosed in 2011. She is status post abdominoperineal resection for moderately differentiated mucinous adenocarcinoma on May 01, 2010. She is status  post neoadjuvant 5-FU with radiation from 01/27/2010 to 03/16/2010. She began Xeloda in the adjuvant setting from July 02, 2010, to October 18, 2010. She received a total of 6 cycles.  She is on active follow up at this time. Her CT scan from 10/08/2014 did not show any disease. The plan is continue follow up in 6 months and repeat CT scan in February 2017. She will not require any further surveillance be on that date.  2. Back pain: Appears to have resolved at this time and likely schedule her in nature.  3. Colonoscopy follow up: She had one in 2014 a likely repeated this year or at most next year.  4. Follow-up: Will be in March 2017 after repeat imaging studies.    [facility], MD 8/26/20168:37 AM

## 2015-04-15 ENCOUNTER — Telehealth: Payer: Self-pay | Admitting: Oncology

## 2015-04-15 ENCOUNTER — Other Ambulatory Visit: Payer: Self-pay | Admitting: Oncology

## 2015-04-15 DIAGNOSIS — C189 Malignant neoplasm of colon, unspecified: Secondary | ICD-10-CM

## 2015-04-15 NOTE — Telephone Encounter (Signed)
Pt called to make labs before CT scan, confirming new D/T... KJ

## 2015-04-15 NOTE — Telephone Encounter (Signed)
Called patient and she is aware of her lab appointment and that central scheduling will call with scan

## 2015-04-16 ENCOUNTER — Other Ambulatory Visit (HOSPITAL_BASED_OUTPATIENT_CLINIC_OR_DEPARTMENT_OTHER): Payer: BLUE CROSS/BLUE SHIELD

## 2015-04-16 DIAGNOSIS — C20 Malignant neoplasm of rectum: Secondary | ICD-10-CM

## 2015-04-16 DIAGNOSIS — C189 Malignant neoplasm of colon, unspecified: Secondary | ICD-10-CM

## 2015-04-16 LAB — CBC WITH DIFFERENTIAL/PLATELET
BASO%: 0.2 % (ref 0.0–2.0)
Basophils Absolute: 0 10*3/uL (ref 0.0–0.1)
EOS ABS: 0 10*3/uL (ref 0.0–0.5)
EOS%: 0.5 % (ref 0.0–7.0)
HCT: 36.8 % (ref 34.8–46.6)
HEMOGLOBIN: 12.7 g/dL (ref 11.6–15.9)
LYMPH#: 1.4 10*3/uL (ref 0.9–3.3)
LYMPH%: 21.8 % (ref 14.0–49.7)
MCH: 30.5 pg (ref 25.1–34.0)
MCHC: 34.5 g/dL (ref 31.5–36.0)
MCV: 88.5 fL (ref 79.5–101.0)
MONO#: 0.5 10*3/uL (ref 0.1–0.9)
MONO%: 8 % (ref 0.0–14.0)
NEUT%: 69.5 % (ref 38.4–76.8)
NEUTROS ABS: 4.5 10*3/uL (ref 1.5–6.5)
Platelets: 260 10*3/uL (ref 145–400)
RBC: 4.16 10*6/uL (ref 3.70–5.45)
RDW: 12.9 % (ref 11.2–14.5)
WBC: 6.5 10*3/uL (ref 3.9–10.3)

## 2015-04-16 LAB — COMPREHENSIVE METABOLIC PANEL (CC13)
ALBUMIN: 4.2 g/dL (ref 3.5–5.0)
ALK PHOS: 50 U/L (ref 40–150)
ALT: 13 U/L (ref 0–55)
AST: 16 U/L (ref 5–34)
Anion Gap: 8 mEq/L (ref 3–11)
BILIRUBIN TOTAL: 0.74 mg/dL (ref 0.20–1.20)
BUN: 15.5 mg/dL (ref 7.0–26.0)
CO2: 26 mEq/L (ref 22–29)
CREATININE: 1 mg/dL (ref 0.6–1.1)
Calcium: 9.9 mg/dL (ref 8.4–10.4)
Chloride: 107 mEq/L (ref 98–109)
EGFR: 61 mL/min/{1.73_m2} — ABNORMAL LOW (ref >90)
GLUCOSE: 126 mg/dL (ref 70–140)
Potassium: 4.1 mEq/L (ref 3.5–5.1)
SODIUM: 142 meq/L (ref 136–145)
TOTAL PROTEIN: 7.1 g/dL (ref 6.4–8.3)

## 2015-04-17 ENCOUNTER — Encounter (HOSPITAL_COMMUNITY): Payer: Self-pay

## 2015-04-17 ENCOUNTER — Ambulatory Visit (HOSPITAL_COMMUNITY)
Admission: RE | Admit: 2015-04-17 | Discharge: 2015-04-17 | Disposition: A | Discharge status: Home / Self Care | Payer: BLUE CROSS/BLUE SHIELD | Source: Ambulatory Visit | Attending: Oncology | Admitting: Oncology

## 2015-04-17 DIAGNOSIS — C189 Malignant neoplasm of colon, unspecified: Secondary | ICD-10-CM

## 2015-04-17 DIAGNOSIS — Z08 Encounter for follow-up examination after completed treatment for malignant neoplasm: Secondary | ICD-10-CM | POA: Insufficient documentation

## 2015-04-17 DIAGNOSIS — Z933 Colostomy status: Secondary | ICD-10-CM | POA: Diagnosis not present

## 2015-04-17 LAB — CEA: CEA: <0.5 ng/mL (ref 0.0–5.0)

## 2015-04-17 MED ORDER — IOHEXOL 300 MG/ML  SOLN
100.0000 mL | Freq: Once | INTRAMUSCULAR | Status: AC | PRN
  Administered 2015-04-17: 100 mL via INTRAVENOUS

## 2015-04-18 ENCOUNTER — Other Ambulatory Visit: Payer: BLUE CROSS/BLUE SHIELD

## 2015-05-14 ENCOUNTER — Other Ambulatory Visit: Payer: Self-pay | Admitting: Obstetrics & Gynecology

## 2015-05-14 DIAGNOSIS — N6320 Unspecified lump in the left breast, unspecified quadrant: Secondary | ICD-10-CM

## 2015-05-20 ENCOUNTER — Other Ambulatory Visit: Payer: BLUE CROSS/BLUE SHIELD

## 2015-05-28 ENCOUNTER — Ambulatory Visit
Admission: RE | Admit: 2015-05-28 | Discharge: 2015-05-28 | Disposition: A | Discharge status: Home / Self Care | Payer: BLUE CROSS/BLUE SHIELD | Source: Ambulatory Visit | Attending: Obstetrics & Gynecology | Admitting: Obstetrics & Gynecology

## 2015-05-28 ENCOUNTER — Other Ambulatory Visit: Payer: Self-pay | Admitting: Obstetrics & Gynecology

## 2015-05-28 DIAGNOSIS — N6320 Unspecified lump in the left breast, unspecified quadrant: Secondary | ICD-10-CM

## 2015-05-30 ENCOUNTER — Ambulatory Visit
Admission: RE | Admit: 2015-05-30 | Discharge: 2015-05-30 | Disposition: A | Discharge status: Home / Self Care | Payer: BLUE CROSS/BLUE SHIELD | Source: Ambulatory Visit | Attending: Obstetrics & Gynecology | Admitting: Obstetrics & Gynecology

## 2015-05-30 DIAGNOSIS — N6320 Unspecified lump in the left breast, unspecified quadrant: Secondary | ICD-10-CM

## 2015-07-31 ENCOUNTER — Other Ambulatory Visit: Payer: Self-pay | Admitting: Obstetrics & Gynecology

## 2015-07-31 DIAGNOSIS — N6320 Unspecified lump in the left breast, unspecified quadrant: Secondary | ICD-10-CM

## 2015-08-21 ENCOUNTER — Ambulatory Visit
Admission: RE | Admit: 2015-08-21 | Discharge: 2015-08-21 | Disposition: A | Discharge status: Home / Self Care | Payer: BLUE CROSS/BLUE SHIELD | Source: Ambulatory Visit | Attending: Obstetrics & Gynecology | Admitting: Obstetrics & Gynecology

## 2015-08-21 DIAGNOSIS — N6320 Unspecified lump in the left breast, unspecified quadrant: Secondary | ICD-10-CM

## 2015-09-05 ENCOUNTER — Other Ambulatory Visit: Payer: BLUE CROSS/BLUE SHIELD

## 2015-10-14 ENCOUNTER — Encounter: Payer: Self-pay | Admitting: *Deleted

## 2015-10-14 DIAGNOSIS — R079 Chest pain, unspecified: Secondary | ICD-10-CM

## 2015-10-15 ENCOUNTER — Other Ambulatory Visit (HOSPITAL_BASED_OUTPATIENT_CLINIC_OR_DEPARTMENT_OTHER): Payer: BLUE CROSS/BLUE SHIELD

## 2015-10-15 ENCOUNTER — Encounter (HOSPITAL_COMMUNITY): Payer: Self-pay

## 2015-10-15 ENCOUNTER — Ambulatory Visit (HOSPITAL_COMMUNITY)
Admission: RE | Admit: 2015-10-15 | Discharge: 2015-10-15 | Disposition: A | Discharge status: Home / Self Care | Payer: BLUE CROSS/BLUE SHIELD | Source: Ambulatory Visit | Attending: Oncology | Admitting: Oncology

## 2015-10-15 DIAGNOSIS — Z9889 Other specified postprocedural states: Secondary | ICD-10-CM | POA: Insufficient documentation

## 2015-10-15 DIAGNOSIS — R079 Chest pain, unspecified: Secondary | ICD-10-CM

## 2015-10-15 DIAGNOSIS — C189 Malignant neoplasm of colon, unspecified: Secondary | ICD-10-CM

## 2015-10-15 DIAGNOSIS — Z933 Colostomy status: Secondary | ICD-10-CM | POA: Diagnosis not present

## 2015-10-15 DIAGNOSIS — K435 Parastomal hernia without obstruction or  gangrene: Secondary | ICD-10-CM | POA: Diagnosis not present

## 2015-10-15 LAB — BASIC METABOLIC PANEL
Anion Gap: 10 mEq/L (ref 3–11)
BUN: 10.9 mg/dL (ref 7.0–26.0)
CALCIUM: 9.7 mg/dL (ref 8.4–10.4)
CO2: 27 meq/L (ref 22–29)
CREATININE: 1 mg/dL (ref 0.6–1.1)
Chloride: 105 mEq/L (ref 98–109)
EGFR: 64 mL/min/{1.73_m2} — ABNORMAL LOW (ref >90)
Glucose: 88 mg/dl (ref 70–140)
Potassium: 4.6 mEq/L (ref 3.5–5.1)
SODIUM: 142 meq/L (ref 136–145)

## 2015-10-15 MED ORDER — IOHEXOL 300 MG/ML  SOLN
100.0000 mL | Freq: Once | INTRAMUSCULAR | Status: AC | PRN
  Administered 2015-10-15: 100 mL via INTRAVENOUS

## 2015-10-16 LAB — CEA: CEA: 1 ng/mL (ref 0.0–4.7)

## 2015-10-16 LAB — CEA (PARALLEL TESTING): CEA: <0.5 ng/mL

## 2015-10-17 ENCOUNTER — Telehealth: Payer: Self-pay | Admitting: Oncology

## 2015-10-17 ENCOUNTER — Ambulatory Visit (HOSPITAL_BASED_OUTPATIENT_CLINIC_OR_DEPARTMENT_OTHER): Payer: BLUE CROSS/BLUE SHIELD | Admitting: Oncology

## 2015-10-17 VITALS — BP 126/70 | HR 77 | Temp 97.6°F | Resp 18 | Ht 66.0 in | Wt 164.0 lb

## 2015-10-17 DIAGNOSIS — R109 Unspecified abdominal pain: Secondary | ICD-10-CM | POA: Diagnosis not present

## 2015-10-17 DIAGNOSIS — Z85038 Personal history of other malignant neoplasm of large intestine: Secondary | ICD-10-CM

## 2015-10-17 DIAGNOSIS — C189 Malignant neoplasm of colon, unspecified: Secondary | ICD-10-CM

## 2015-10-17 NOTE — Telephone Encounter (Signed)
gv and printed appt sched and avs for pt for April 2017 and March 2018

## 2015-10-17 NOTE — Progress Notes (Signed)
Hematology and Oncology Follow Up Visit  [name] [id number] 1956-06-26 60 y.o. 10/17/2015 9:04 AM Colleen Kirby, MDBarnes, Colleen Mola, MD   Principle Diagnosis: 60 year old with T3N0 rectal cancer diagnosed in 04/2010.    Prior Therapy: 1. She is status post neoadjuvant 5- FU with radiation from 01/27/2010 to 03/16/2010 receiving a total of 50.4 Gy with continuous infusion 5-FU. 2. status post abdominoperineal resection for moderately differentiated mucinous adenocarcinoma on May 01, 2010. Tumor was downgraded to T1 with pathology revealing lakes of mucin with sparse glandular epithelium in the submucosa with no evidence of LV invasion. None of the 10 lymph  nodes sampled had evidence of malignancy. 3. She began Xeloda in the adjuvant setting from July 02, 2010, to October 18, 2010. She received a total of 6 cycles.   Current therapy: Observation and follow up.   Interim History:  Colleen Kirby presents today for a follow up visit with her husband. Since the last visit, she reports occasional crampy abdominal pain that has been intermittent in nature. It is not associated with food and not associated with any nausea, vomiting or hematochezia. She has not reported any major changes in her performance status and activity level. She is able to exercise twice a week and perform all of her activities of daily living.  She does not report any headaches, blurry vision or syncope. She does not report any fevers, chills or sweats. She does report intentional weight loss but no change in her appetite. She does report any chest pain or palpitation. She does not report any cough or wheezing. She reports no urinary symptoms. She does not report any skeletal complaints. Rest of her review of systems unremarkable.   Medications: I have reviewed the patient's current medications. Current Outpatient Prescriptions  Medication Sig Dispense Refill  . albuterol (PROVENTIL HFA;VENTOLIN HFA)  108 (90 BASE) MCG/ACT inhaler Inhale 2 puffs into the lungs every 6 (six) hours as needed. Wheezing    . Alpha-D-Galactosidase (BEANO PO) Take 1 tablet by mouth as needed (GAS). beano    . aspirin EC 81 MG tablet Take 81 mg by mouth daily.    . Calcium Citrate-Vitamin D (CITRACAL + D PO) Take 800 mg by mouth daily. Vitamin c 1200mg , vitamin d 2000 units    . Carboxymethylcell-Hypromellose 0.25-0.3 % GEL Place 1 application into both eyes at bedtime.    . cycloSPORINE (RESTASIS) 0.05 % ophthalmic emulsion Place 1 drop into both eyes 2 (two) times daily.     Marland Kitchen estradiol (ESTRACE) 0.1 MG/GM vaginal cream Place 2 g vaginally. 2 times weekly    . hydrOXYzine (ATARAX/VISTARIL) 10 MG tablet Take 10 mg by mouth at bedtime. Patient takes at bedtime    . ibuprofen (ADVIL,MOTRIN) 200 MG tablet Take 200-400 mg by mouth every 6 (six) hours as needed. Pain    . metoprolol succinate (TOPROL-XL) 25 MG 24 hr tablet TAKE 1/2 TABLET BY MOUTH DAILY 15 tablet 7  . Omega-3 Fatty Acids (OMEGA 3 PO) Take 1,400 mg by mouth daily.    . pantoprazole (PROTONIX) 40 MG tablet Take 1 tablet (40 mg total) by mouth daily. 90 tablet 3  . Probiotic Product (ALIGN PO) Take 1 tablet by mouth daily. 1 tab daily     No current facility-administered medications for this visit.    Allergies:  Allergies  Allergen Reactions  . Adhesive [Tape] Itching and Rash    Past Medical History, Surgical history, Social history, and Family History were reviewed and updated.  Physical Exam: Blood pressure 126/70, pulse 77, temperature 97.6 F (36.4 C), temperature source Oral, resp. rate 18, height 5\' 6"  (1.676 m), weight 164 lb (74.39 kg), SpO2 100 %. ECOG: 0 General appearance: alert, healthy-appearing woman without distress. Head: Normocephalic, without obvious abnormality no oral ulcers or lesions. Neck: no adenopathy Lymph nodes: Cervical, supraclavicular, and axillary nodes normal. Heart:regular rate and rhythm, S1, S2 normal, no  murmur, click, rub or gallop Lung:chest clear, no wheezing, rales, normal symmetric air entry no dullness to percussion. Abdomin: soft, non-tender, without masses or organomegaly no rebound or guarding. EXT:no erythema, induration, or nodules   Lab Results: Lab Results  Component Value Date   WBC 6.5 04/16/2015   HGB 12.7 04/16/2015   HCT 36.8 04/16/2015   MCV 88.5 04/16/2015   PLT 260 04/16/2015     Chemistry      Component Value Date/Time   NA 142 10/15/2015 0805   NA 138 03/07/2012 1112   NA 142 09/21/2011 0931   K 4.6 10/15/2015 0805   K 4.4 03/07/2012 1112   K 4.1 09/21/2011 0931   CL 104 09/20/2012 0922   CL 104 03/07/2012 1112   CL 102 09/21/2011 0931   CO2 27 10/15/2015 0805   CO2 29 03/07/2012 1112   CO2 29 09/21/2011 0931   BUN 10.9 10/15/2015 0805   BUN 15 03/07/2012 1112   BUN 17 09/21/2011 0931   CREATININE 1.0 10/15/2015 0805   CREATININE 0.83 03/07/2012 1112   CREATININE 0.9 09/21/2011 0931      Component Value Date/Time   CALCIUM 9.7 10/15/2015 0805   CALCIUM 9.6 03/07/2012 1112   CALCIUM 9.2 09/21/2011 0931   ALKPHOS 50 04/16/2015 1421   ALKPHOS 58 03/07/2012 1112   ALKPHOS 62 09/21/2011 0931   AST 16 04/16/2015 1421   AST 15 03/07/2012 1112   AST 20 09/21/2011 0931   ALT 13 04/16/2015 1421   ALT 14 03/07/2012 1112   ALT 19 09/21/2011 0931   BILITOT 0.74 04/16/2015 1421   BILITOT 0.4 03/07/2012 1112   BILITOT 0.80 09/21/2011 0931     Results for SEVIN, LEIBEL (MRN [id number]) as of 10/17/2015 08:05  Ref. Range 04/16/2015 14:20 10/15/2015 08:05  CEA Latest Units: ng/mL <0.5 <0.5  EXAM: CT CHEST, ABDOMEN, AND PELVIS WITH CONTRAST  TECHNIQUE: Multidetector CT imaging of the chest, abdomen and pelvis was performed following the standard protocol during bolus administration of intravenous contrast.  CONTRAST: 120mL OMNIPAQUE IOHEXOL 300 MG/ML SOLN  COMPARISON: 04/17/2015  FINDINGS: CT CHEST FINDINGS  Mediastinum/Nodes: No  supraclavicular adenopathy. Tortuous descending thoracic aorta. Normal heart size, without pericardial effusion. No central pulmonary embolism, on this non-dedicated study. No mediastinal or hilar adenopathy.  A nodule superficial to the right-side of the sternum is similar at 4 mm, including on image 30/series 2.  Lungs/Pleura: No pleural fluid. h Clear lungs.  Musculoskeletal: No acute osseous abnormality.  CT ABDOMEN PELVIS FINDINGS  Hepatobiliary: Normal liver. Normal gallbladder, without biliary ductal dilatation.  Pancreas: Normal, without mass or ductal dilatation.  Spleen: Normal in size, without focal abnormality.  Adrenals/Urinary Tract: Normal adrenal glands. Right extrarenal pelvis. Normal left kidney and urinary bladder.  Stomach/Bowel: Proximal gastric underdistention. Rectal resection, with anatomic distortion and displacement of the uterus posteriorly. Presacral fascia thickening is likely postoperative and radiation induced. No locally recurrent disease. Left lower quadrant colostomy. Small bowel containing parastomal hernia. Colonic stool burden suggests constipation. Normal terminal ileum and appendix. Otherwise normal small bowel.  Vascular/Lymphatic: Normal caliber  of the aorta and branch vessels. No abdominopelvic adenopathy.  Reproductive: Otherwise normal appearance of the uterus, without adnexal mass.  Other: No significant free fluid. No evidence of omental or peritoneal disease. Minimal ventral abdominal wall laxity or hernia containing fat, similar.  Musculoskeletal: No acute osseous abnormality.  IMPRESSION: 1. Status post rectal resection and left lower port colostomy. No evidence of recurrent or metastatic disease in the chest, abdomen, or pelvis. 2. Small bowel containing parastomal hernia, as before.    Impression and Plan:  60 year old woman with:  1. T3N0 rectal cancer diagnosed in 2011. She is status post  abdominoperineal resection for moderately differentiated mucinous adenocarcinoma on May 01, 2010. She is status post neoadjuvant 5-FU with radiation from 01/27/2010 to 03/16/2010. She began Xeloda in the adjuvant setting from July 02, 2010, to October 18, 2010. She received a total of 6 cycles.  She continues to be disease free close to 5 years out that this time. CT scan on 10/15/2015 was reviewed today and showed no evidence of recurrent disease. I see no need for any further imaging studies at this time and I will see her back in 1 year for clinical visit and examination. There is also possible to suspend oncology follow-up after that.  2. Crampy abdominal pain: Unclear etiology but no abnormalities noted in her imaging studies. 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  3. Colonoscopy follow up: She had one in 2014 a likely repeated soon given her increased risk of developing another colon cancer.  4. Genetic counseling: She does have a family history of breast cancer as well as colon cancer she also has a son with testicular cancer. I will refer her to genetic counseling for discussion for possible screening for hereditary syndrome.  5. Follow-up: Will be in one year and sooner if needed to.    [facility], MD 3/2/20179:04 AM

## 2015-11-18 ENCOUNTER — Other Ambulatory Visit: Payer: BLUE CROSS/BLUE SHIELD

## 2015-11-18 ENCOUNTER — Ambulatory Visit (HOSPITAL_BASED_OUTPATIENT_CLINIC_OR_DEPARTMENT_OTHER): Payer: BLUE CROSS/BLUE SHIELD | Admitting: Genetic Counselor

## 2015-11-18 ENCOUNTER — Encounter: Payer: Self-pay | Admitting: Genetic Counselor

## 2015-11-18 DIAGNOSIS — Z800 Family history of malignant neoplasm of digestive organs: Secondary | ICD-10-CM | POA: Diagnosis not present

## 2015-11-18 DIAGNOSIS — Z8043 Family history of malignant neoplasm of testis: Secondary | ICD-10-CM | POA: Insufficient documentation

## 2015-11-18 DIAGNOSIS — Z808 Family history of malignant neoplasm of other organs or systems: Secondary | ICD-10-CM

## 2015-11-18 DIAGNOSIS — Z315 Encounter for genetic counseling: Secondary | ICD-10-CM

## 2015-11-18 DIAGNOSIS — C189 Malignant neoplasm of colon, unspecified: Secondary | ICD-10-CM | POA: Insufficient documentation

## 2015-11-18 DIAGNOSIS — Z85038 Personal history of other malignant neoplasm of large intestine: Secondary | ICD-10-CM

## 2015-11-18 DIAGNOSIS — Z803 Family history of malignant neoplasm of breast: Secondary | ICD-10-CM | POA: Diagnosis not present

## 2015-11-18 NOTE — Progress Notes (Signed)
REFERRING PROVIDER: Leighton Ruff, MD [place], [place] [address]   Zola Button, MD  PRIMARY PROVIDER:  [name], MD  PRIMARY REASON FOR VISIT:  1. Malignant neoplasm of colon, unspecified part of colon ([place])   2. Family history of colon cancer   3. Family history of testicular cancer   4. Family history of breast cancer      HISTORY OF PRESENT ILLNESS:   Ms. Elicker, a 60 y.o. female, was seen for a [facility] cancer genetics consultation at the request of Dr. Alen Blew due to a personal and family history of cancer.  Ms. Mccown presents to clinic today to discuss the possibility of a hereditary predisposition to cancer, genetic testing, and to further clarify her future cancer risks, as well as potential cancer risks for family members.   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 This was treated with chemotherapy, radiation and surgery.    CANCER HISTORY:   No history exists.     HORMONAL RISK FACTORS:  Menarche was at age 46.  First live birth at age 67.  OCP use for approximately 5 years years.  Ovaries intact: yes.  Hysterectomy: no.  Menopausal status: postmenopausal.  HRT use: 1-2 years. Colonoscopy: yes; no polyps. Mammogram within the last year: yes. Number of breast biopsies: 0. Up to date with pelvic exams:  yes. Any excessive radiation exposure in the past:  Just for cancer treatment  Past Medical History  Diagnosis Date  . GERD (gastroesophageal reflux disease)   . Atypical chest pain   . Palpitations   . History of DVT (deep vein thrombosis)   . Rectal cancer ([place])   . Family history of colon cancer   . Family history of testicular cancer   . Family history of breast cancer   . Colon cancer ([place]) 2011    mucinous adenocarcinoma    History reviewed. No pertinent past surgical history.  Social History   Social History  . Marital Status: Married    Spouse  Name: N/A  . Number of Children: 3  . Years of Education: N/A   Social History Main Topics  . Smoking status: Never Smoker   . Smokeless tobacco: None  . Alcohol Use: No  . Drug Use: None  . Sexual Activity: Not Asked   Other Topics Concern  . None   Social History Narrative     FAMILY HISTORY:  We obtained a detailed, 4-generation family history.  Significant diagnoses are listed below: Family History  Problem Relation Age of Onset  . Coronary artery disease    . Arrhythmia    . Congestive Heart Failure Mother   . Hypertension Mother   . Breast cancer Mother 65  . Heart disease Father   . Hypertension Brother   . Healthy Brother   . Pneumonia Maternal Uncle     HIV related  . Colon cancer Paternal Aunt     died in her 43s  . Heart disease Maternal Grandmother   . Breast cancer Paternal Grandmother     dx in her 25s, died at 28  . Heart disease Paternal Grandfather   . Prostate cancer Cousin     paternal cousin with prostate cancer in his 6s  . Testicular cancer Son 35    The patient ha three sons, one who was diagnosed with testicular cancer at age 76.  The patient has two brothers who are cancer free.  Her mother was diagnosed with breast cancer at age 15. There is no other cancer history on the maternal side of the family.  The patient's father is alive at 51.  He had three sisters and a brother. One sister died in her 78s from colon cancer.  This sister has a son who had prostate cancer in his 18s.  The patient's paternal grandmother was diagnosed with breast cancer at age 65 and died at 44.  There are other family members with cancer, but hte patient is unsure who they are and which side of the family they are on.  Patient's maternal ancestors are of [place] descent, and paternal ancestors are of [place] and [place] descent. There is no reported Ashkenazi Jewish ancestry. There is no known consanguinity.  GENETIC COUNSELING ASSESSMENT: RAINA SOLE is a 59  y.o. female with a personal and family history of cancer which is somewhat suggestive of a familial risk for cancer and predisposition to cancer. We, therefore, discussed and recommended the following at today's visit.   DISCUSSION: We discussed that about 5% of colon cancer is hereditary, with the most common cause a result of Lynch syndrome.  We reviewed Lynch syndrome and the cancers that are related including GI, GYN and GU cancers. We also discussed that some families tend to have more breast cancer in them, and we have seen testicular cancer in some families, but these do not tend to be hallmark cancers. We discussed with Ms. Chaisson that the family history is not highly consistent with a familial hereditary cancer syndrome, and we feel she is at low risk to harbor a gene mutation associated with such a condition. Thus, we did not recommend any genetic testing, at this time, and recommended Ms. Fenner continue to follow the cancer screening guidelines given by her primary healthcare provider.  The patient was diagnosed with colon cancer in 2011, which occurred before Universal testing for Lynch syndrome on colon tumors.  We discussed MSI/IHC testing and the possibility of ordering that test on her colon tumor.  She had her tumor rese[facility] at [facility], so we would need to order her tumor in order to do testing on it. If she were found to have MSI H or IHC-loss she would meet criteria for testing.  We reviewed current testing options, including ordering testing, having her insurance preauthorized and see if they would cover testing, as well as self pay options.    Lastly, we had a long conversation about privacy issues and genetic testing.  We reviewed that there have not been any documented cases of insurance discrimination in the cancer testing arena.  However, this is a fear, and in 2008 the Genetic Information Non-Discrimination Act (GINA) was passed.  This prevents discrimination in the insurance and  employment area.  We also discussed that the Affordable Care Act ([place]) strengthened GINA by placing genetic testing under prevention care and not allowing pre-existing conditions. We reviewed the characteristics, features and inheritance patterns of hereditary cancer syndromes. We also discussed genetic testing, including the appropriate family members to test, the process of testing, insurance coverage and turn-around-time for results. We discussed the implications of a negative, positive and/or variant of uncertain significant result.    PLAN: Ms. Slee will think about our discussion and let us know if she wants to proceed with either tumor testing or genetic testing.  Lastly, we encouraged Ms. Bomar to remain in contact with cancer genetics annually so that we can continuously update the family history  and inform her of any changes in cancer genetics and testing that may be of benefit for this family.   Ms.  Allemand questions were answered to her satisfaction today. Our contact information was provided should additional questions or concerns arise. Thank you for the referral and allowing [place] to share in the care of your patient.   Roxas Clymer P. Florene Glen, [place], [facility] Certified Genetic Counselor [email or web address] phone: [phone]  The patient was seen for a total of 60 minutes in face-to-face genetic counseling.  This patient was discussed with Drs. Magrinat, Lindi Adie and/or Burr Medico who agrees with the above.    _______________________________________________________________________ For Office Staff:  Number of people involved in session: 1 Was an Intern/ student involved with case: no

## 2015-11-27 ENCOUNTER — Other Ambulatory Visit: Payer: Self-pay

## 2015-11-27 MED ORDER — PANTOPRAZOLE SODIUM 40 MG PO TBEC: 40.0000 mg | DELAYED_RELEASE_TABLET | Freq: Every day | ORAL | Status: DC

## 2015-11-30 ENCOUNTER — Other Ambulatory Visit: Payer: Self-pay | Admitting: Cardiology

## 2015-12-16 DIAGNOSIS — H52203 Unspecified astigmatism, bilateral: Secondary | ICD-10-CM | POA: Diagnosis not present

## 2015-12-16 DIAGNOSIS — H04123 Dry eye syndrome of bilateral lacrimal glands: Secondary | ICD-10-CM | POA: Diagnosis not present

## 2016-01-24 DIAGNOSIS — Z933 Colostomy status: Secondary | ICD-10-CM | POA: Diagnosis not present

## 2016-01-24 DIAGNOSIS — C20 Malignant neoplasm of rectum: Secondary | ICD-10-CM | POA: Diagnosis not present

## 2016-01-31 ENCOUNTER — Other Ambulatory Visit: Payer: Self-pay | Admitting: Cardiology

## 2016-02-27 ENCOUNTER — Other Ambulatory Visit: Payer: Self-pay | Admitting: Cardiology

## 2016-03-04 ENCOUNTER — Ambulatory Visit (INDEPENDENT_AMBULATORY_CARE_PROVIDER_SITE_OTHER): Payer: BLUE CROSS/BLUE SHIELD | Admitting: Cardiology

## 2016-03-04 ENCOUNTER — Encounter: Payer: Self-pay | Admitting: Cardiology

## 2016-03-04 VITALS — BP 128/74 | HR 70 | Ht 66.0 in | Wt 165.0 lb

## 2016-03-04 DIAGNOSIS — R002 Palpitations: Secondary | ICD-10-CM

## 2016-03-04 LAB — BASIC METABOLIC PANEL
BUN: 14 mg/dL (ref 7–25)
CHLORIDE: 107 mmol/L (ref 98–110)
CO2: 24 mmol/L (ref 20–31)
Calcium: 8.8 mg/dL (ref 8.6–10.4)
Creat: 0.97 mg/dL (ref 0.50–0.99)
Glucose, Bld: 99 mg/dL (ref 65–99)
POTASSIUM: 4.1 mmol/L (ref 3.5–5.3)
SODIUM: 140 mmol/L (ref 135–146)

## 2016-03-04 LAB — TSH: TSH: 1.21 m[IU]/L

## 2016-03-04 LAB — MAGNESIUM: MAGNESIUM: 2 mg/dL (ref 1.5–2.5)

## 2016-03-04 MED ORDER — METOPROLOL SUCCINATE ER 25 MG PO TB24
25.0000 mg | ORAL_TABLET | Freq: Every day | ORAL | Status: DC
Start: 2016-03-04 — End: 2017-03-05

## 2016-03-04 NOTE — Patient Instructions (Signed)
Medication Instructions:  You can increase your Toprol XL to 25mg  daily.   Labwork: BMET/Magnesium/TSH today  Testing/Procedures: Your physician has recommended that you wear a holter monitor. Holter monitors are medical devices that record the heart's electrical activity. Doctors most often use these monitors to diagnose arrhythmias. Arrhythmias are problems with the speed or rhythm of the heartbeat. The monitor is a small, portable device. You can wear one while you do your normal daily activities. This is usually used to diagnose what is causing palpitations/syncope (passing out). 52 HOUR    Follow-Up: Your physician wants you to follow-up in: 1 year with Dr Aundra Dubin. (July 2018).  You will receive a reminder letter in the mail two months in advance. If you don't receive a letter, please call our office to schedule the follow-up appointment.       If you need a refill on your cardiac medications before your next appointment, please call your pharmacy.

## 2016-03-05 NOTE — Progress Notes (Signed)
Patient ID: Colleen Kirby, female   DOB: March 14, 1956, 60 y.o.   MRN: [id number] PCP: Dr. Drema Dallas  60 yo presented initially for evaluation of palpitations/atrial arrhythmias. She was noted on 48 hour holter monitor done for palpitations to have an episode of SVT (possible atrial flutter) at a rate of 150 bpm.  I started her on low dose Toprol XL and her palpitations decreased considerably.  She did a 3 week event monitor in 3/11 on Toprol XL that showed no atrial fibrillation, atrial flutter, or other SVT.   Recently, she has noted more palpitations when lying in bed at night.  She does not note palpitations during the day.  No long runs of tachycardia.  No lightheadedness.  Good exercise tolerance: she works out at [employer] twice a week and walks on other days.  No significant dyspnea with exertion or chest pain.   ECG: NSR, normal  Labs (4/11): lipomed profile showed LDL 117, HDL 41, LDL size 21, small LDL-P 355.  This is not a high risk profile.  Labs (2/13): 4.1, creatinine 0.4 Labs (2/14): K 4.6, creatinine 0.9 Labs (2/17): K 4.6, creatinine 1.0  Allergies (verified):  No Known Drug Allergies  Past Medical History: 1. GERD 2. Atypical chest pain: Stress echo (2/11) showed EF 60%, normal valves and normal RV.  There was no evidence for ischemia with stress: all walls augmented appropriately.    3. Palpitations: Holter monitor (3/11) showed 1 run of SVT (possible atrial fiutter) at a rate of 150 bpm.  Event monitor (4/11) for 3 wks showed no atrial fib, atrial flutter, or other SVT.  4. DVT several years ago when on HRT.  Took coumadin for a few months.   Family History: Father was found to have CAD in his early 73s and had CABG at 45.  Grandfather had CABG at 25.  2 brothers are healthy.  Mother with atrial fibrillation.   Social History: Married, not working Tobacco Use - No.  Alcohol Use - no  ROS:  All systems reviewed and negative except as per HPI.   Current Outpatient  Prescriptions  Medication Sig Dispense Refill  . albuterol (PROVENTIL HFA;VENTOLIN HFA) 108 (90 BASE) MCG/ACT inhaler Inhale 2 puffs into the lungs every 6 (six) hours as needed. Wheezing    . Alpha-D-Galactosidase (BEANO PO) Take 1 tablet by mouth as needed (GAS). beano    . aspirin EC 81 MG tablet Take 81 mg by mouth daily.    . Calcium Citrate-Vitamin D (CITRACAL + D PO) Take 400 mg by mouth daily. Vitamin C 600mg , Vitamin D 1000 units    . Carboxymethylcell-Hypromellose 0.25-0.3 % GEL Place 1 application into both eyes at bedtime.    . cholecalciferol (VITAMIN D) 1000 units tablet Take 1,000 Units by mouth daily.    . cycloSPORINE (RESTASIS) 0.05 % ophthalmic emulsion Place 1 drop into both eyes 2 (two) times daily.     Marland Kitchen estradiol (ESTRACE) 0.1 MG/GM vaginal cream Place 2 g vaginally. Every 3rd day    . hydrOXYzine (ATARAX/VISTARIL) 10 MG tablet Take 10 mg by mouth at bedtime. Patient takes at bedtime    . ibuprofen (ADVIL,MOTRIN) 200 MG tablet Take 200-400 mg by mouth every 6 (six) hours as needed. Pain    . Omega-3 Fatty Acids (OMEGA 3 PO) Take 1,400 mg by mouth daily.    . pantoprazole (PROTONIX) 40 MG tablet Take 1 tablet (40 mg total) by mouth daily. 90 tablet 3  . Probiotic Product (ALIGN  PO) Take 1 tablet by mouth every other day.     . metoprolol succinate (TOPROL XL) 25 MG 24 hr tablet Take 1 tablet (25 mg total) by mouth daily. 90 tablet 3   No current facility-administered medications for this visit.    BP 128/74 mmHg  Pulse 70  Ht 5\' 6"  (1.676 m)  Wt 165 lb (74.844 kg)  BMI 26.64 kg/m2 General: NAD Neck: No JVD, no thyromegaly or thyroid nodule.  Lungs: Clear to auscultation bilaterally with normal respiratory effort. CV: Nondisplaced PMI.  Heart regular S1/S2, no S3/S4, no murmur.  No edema but bilateral ankle varicosities.  No carotid bruit.  Normal pedal pulses.  Abdomen: Soft, nontender, no hepatosplenomegaly, no distention.  Neurologic: Alert and oriented x 3.   Psych: Normal affect. Extremities: No clubbing or cyanosis.   Palpitations: Patient was noted in the past to have SVT, possibly atrial flutter.  CHADSVASC score = 1, so if the arrhythmia seen on monitor in the past was indeed atrial flutter, ASA should suffice.  She has been having more palpitations recently, mainly at night when she's lying in bed.  No long runs of lightheadedness, no dizziness.   - 48 hour holter.  - Continue Toprol XL, would take in the evening as symptoms are at night.  Can try increasing to 25 mg daily to see if this helps.   - Check BMET, Mg, TSH.   Followup 1 year if no significant arrhythmia found.   Loralie Champagne 03/05/2016

## 2016-03-06 ENCOUNTER — Other Ambulatory Visit: Payer: Self-pay | Admitting: *Deleted

## 2016-03-06 DIAGNOSIS — R002 Palpitations: Secondary | ICD-10-CM

## 2016-03-16 ENCOUNTER — Ambulatory Visit (INDEPENDENT_AMBULATORY_CARE_PROVIDER_SITE_OTHER): Payer: BLUE CROSS/BLUE SHIELD

## 2016-03-16 DIAGNOSIS — R002 Palpitations: Secondary | ICD-10-CM

## 2016-04-07 ENCOUNTER — Telehealth: Payer: Self-pay | Admitting: Cardiology

## 2016-04-07 NOTE — Telephone Encounter (Signed)
That is fine 

## 2016-04-07 NOTE — Telephone Encounter (Signed)
Patient would like to transfer the refill management of her Pantoprazole 40 mg from Dr. Aundra Dubin to Dr. Earlean Shawl.  She feel this just makes more sense.  Please call

## 2016-04-07 NOTE — Telephone Encounter (Signed)
Discussed with patient, she will contact Dr Earlean Shawl.

## 2016-04-13 DIAGNOSIS — K219 Gastro-esophageal reflux disease without esophagitis: Secondary | ICD-10-CM | POA: Diagnosis not present

## 2016-05-06 DIAGNOSIS — R8271 Bacteriuria: Secondary | ICD-10-CM | POA: Diagnosis not present

## 2016-05-18 DIAGNOSIS — Z13820 Encounter for screening for osteoporosis: Secondary | ICD-10-CM | POA: Diagnosis not present

## 2016-05-18 DIAGNOSIS — Z01419 Encounter for gynecological examination (general) (routine) without abnormal findings: Secondary | ICD-10-CM | POA: Diagnosis not present

## 2016-05-18 DIAGNOSIS — Z6826 Body mass index (BMI) 26.0-26.9, adult: Secondary | ICD-10-CM | POA: Diagnosis not present

## 2016-05-21 DIAGNOSIS — Z85048 Personal history of other malignant neoplasm of rectum, rectosigmoid junction, and anus: Secondary | ICD-10-CM | POA: Diagnosis not present

## 2016-05-21 DIAGNOSIS — R1013 Epigastric pain: Secondary | ICD-10-CM | POA: Diagnosis not present

## 2016-05-21 DIAGNOSIS — Z933 Colostomy status: Secondary | ICD-10-CM | POA: Diagnosis not present

## 2016-05-21 DIAGNOSIS — Z85038 Personal history of other malignant neoplasm of large intestine: Secondary | ICD-10-CM | POA: Diagnosis not present

## 2016-05-21 DIAGNOSIS — K219 Gastro-esophageal reflux disease without esophagitis: Secondary | ICD-10-CM | POA: Diagnosis not present

## 2016-06-23 DIAGNOSIS — E559 Vitamin D deficiency, unspecified: Secondary | ICD-10-CM | POA: Diagnosis not present

## 2016-06-23 DIAGNOSIS — Z0000 Encounter for general adult medical examination without abnormal findings: Secondary | ICD-10-CM | POA: Diagnosis not present

## 2016-06-23 DIAGNOSIS — E7800 Pure hypercholesterolemia, unspecified: Secondary | ICD-10-CM | POA: Diagnosis not present

## 2016-06-26 DIAGNOSIS — Z0000 Encounter for general adult medical examination without abnormal findings: Secondary | ICD-10-CM | POA: Diagnosis not present

## 2016-06-26 DIAGNOSIS — Z23 Encounter for immunization: Secondary | ICD-10-CM | POA: Diagnosis not present

## 2016-07-16 DIAGNOSIS — Z87440 Personal history of urinary (tract) infections: Secondary | ICD-10-CM | POA: Diagnosis not present

## 2016-07-16 DIAGNOSIS — R310 Gross hematuria: Secondary | ICD-10-CM | POA: Diagnosis not present

## 2016-07-16 DIAGNOSIS — N3946 Mixed incontinence: Secondary | ICD-10-CM | POA: Diagnosis not present

## 2016-07-16 DIAGNOSIS — N3040 Irradiation cystitis without hematuria: Secondary | ICD-10-CM | POA: Diagnosis not present

## 2016-08-26 ENCOUNTER — Other Ambulatory Visit: Payer: Self-pay | Admitting: Obstetrics & Gynecology

## 2016-08-26 DIAGNOSIS — Z1231 Encounter for screening mammogram for malignant neoplasm of breast: Secondary | ICD-10-CM

## 2016-08-27 ENCOUNTER — Ambulatory Visit
Admission: RE | Admit: 2016-08-27 | Discharge: 2016-08-27 | Disposition: A | Discharge status: Home / Self Care | Payer: BLUE CROSS/BLUE SHIELD | Source: Ambulatory Visit | Attending: Obstetrics & Gynecology | Admitting: Obstetrics & Gynecology

## 2016-08-27 DIAGNOSIS — Z1231 Encounter for screening mammogram for malignant neoplasm of breast: Secondary | ICD-10-CM

## 2016-08-31 DIAGNOSIS — C20 Malignant neoplasm of rectum: Secondary | ICD-10-CM | POA: Diagnosis not present

## 2016-08-31 DIAGNOSIS — Z933 Colostomy status: Secondary | ICD-10-CM | POA: Diagnosis not present

## 2016-10-15 ENCOUNTER — Ambulatory Visit (HOSPITAL_BASED_OUTPATIENT_CLINIC_OR_DEPARTMENT_OTHER): Payer: BLUE CROSS/BLUE SHIELD | Admitting: Oncology

## 2016-10-15 ENCOUNTER — Other Ambulatory Visit (HOSPITAL_BASED_OUTPATIENT_CLINIC_OR_DEPARTMENT_OTHER): Payer: BLUE CROSS/BLUE SHIELD

## 2016-10-15 VITALS — BP 117/68 | HR 73 | Temp 98.2°F | Resp 18 | Ht 66.0 in | Wt 167.9 lb

## 2016-10-15 DIAGNOSIS — R109 Unspecified abdominal pain: Secondary | ICD-10-CM

## 2016-10-15 DIAGNOSIS — Z85048 Personal history of other malignant neoplasm of rectum, rectosigmoid junction, and anus: Secondary | ICD-10-CM

## 2016-10-15 DIAGNOSIS — C189 Malignant neoplasm of colon, unspecified: Secondary | ICD-10-CM | POA: Diagnosis not present

## 2016-10-15 LAB — COMPREHENSIVE METABOLIC PANEL
ALT: 13 U/L (ref 0–55)
ANION GAP: 9 meq/L (ref 3–11)
AST: 15 U/L (ref 5–34)
Albumin: 4.1 g/dL (ref 3.5–5.0)
Alkaline Phosphatase: 55 U/L (ref 40–150)
BUN: 15.7 mg/dL (ref 7.0–26.0)
CHLORIDE: 106 meq/L (ref 98–109)
CO2: 27 meq/L (ref 22–29)
Calcium: 9.8 mg/dL (ref 8.4–10.4)
Creatinine: 0.9 mg/dL (ref 0.6–1.1)
EGFR: 66 mL/min/{1.73_m2} — AB (ref >90)
GLUCOSE: 66 mg/dL — AB (ref 70–140)
POTASSIUM: 3.9 meq/L (ref 3.5–5.1)
SODIUM: 143 meq/L (ref 136–145)
Total Bilirubin: 0.64 mg/dL (ref 0.20–1.20)
Total Protein: 7.1 g/dL (ref 6.4–8.3)

## 2016-10-15 LAB — CBC WITH DIFFERENTIAL/PLATELET
BASO%: 0.9 % (ref 0.0–2.0)
BASOS ABS: 0 10*3/uL (ref 0.0–0.1)
EOS ABS: 0.1 10*3/uL (ref 0.0–0.5)
EOS%: 2.4 % (ref 0.0–7.0)
HCT: 37.6 % (ref 34.8–46.6)
HGB: 12.9 g/dL (ref 11.6–15.9)
LYMPH%: 29.1 % (ref 14.0–49.7)
MCH: 30.4 pg (ref 25.1–34.0)
MCHC: 34.2 g/dL (ref 31.5–36.0)
MCV: 88.8 fL (ref 79.5–101.0)
MONO#: 0.4 10*3/uL (ref 0.1–0.9)
MONO%: 11.1 % (ref 0.0–14.0)
NEUT%: 56.5 % (ref 38.4–76.8)
NEUTROS ABS: 2.1 10*3/uL (ref 1.5–6.5)
PLATELETS: 272 10*3/uL (ref 145–400)
RBC: 4.24 10*6/uL (ref 3.70–5.45)
RDW: 12.7 % (ref 11.2–14.5)
WBC: 3.8 10*3/uL — ABNORMAL LOW (ref 3.9–10.3)
lymph#: 1.1 10*3/uL (ref 0.9–3.3)

## 2016-10-15 LAB — CEA (IN HOUSE-CHCC): CEA (CHCC-In House): 1.07 ng/mL (ref 0.00–5.00)

## 2016-10-15 NOTE — Progress Notes (Signed)
Hematology and Oncology Follow Up Visit  Colleen Kirby [id number] 1955-09-22 61 y.o. 10/15/2016 9:14 AM Colleen Kirby, MDBarnes, Colleen Mola, MD   Principle Diagnosis: 69- year old with T3N0 rectal cancer diagnosed in 04/2010.    Prior Therapy: 1. She is status post neoadjuvant 5- FU with radiation from 01/27/2010 to 03/16/2010 receiving a total of 50.4 Gy with continuous infusion 5-FU. 2. status post abdominoperineal resection for moderately differentiated mucinous adenocarcinoma on May 01, 2010. Tumor was downgraded to T1 with pathology revealing lakes of mucin with sparse glandular epithelium in the submucosa with no evidence of LV invasion. None of the 10 lymph  nodes sampled had evidence of malignancy. 3. She began Xeloda in the adjuvant setting from July 02, 2010, to October 18, 2010. She received a total of 6 cycles.   Current therapy: Observation and follow up.   Interim History:  Colleen Kirby presents today for a follow up visit with her husband. Since the last visit, she reports no changes in her health or new complaints. She does report occasional crampy abdominal pain which has not changed since that time. It is not associated with food and not associated with any nausea, vomiting or hematochezia. She has not reported any major changes in her performance status and activity level. She is able to exercise twice a week and perform all of her activities of daily living. She underwent a colonoscopy in April 2017.  She does not report any headaches, blurry vision or syncope. She does not report any fevers, chills or sweats. She does report intentional weight loss but no change in her appetite. She does report any chest pain or palpitation. She does not report any cough or wheezing. She reports no urinary symptoms. She does not report any skeletal complaints. Rest of her review of systems unremarkable.   Medications: I have reviewed the patient's current  medications. Current Outpatient Prescriptions  Medication Sig Dispense Refill  . albuterol (PROVENTIL HFA;VENTOLIN HFA) 108 (90 BASE) MCG/ACT inhaler Inhale 2 puffs into the lungs every 6 (six) hours as needed. Wheezing    . Alpha-D-Galactosidase (BEANO PO) Take 1 tablet by mouth as needed (GAS). beano    . aspirin EC 81 MG tablet Take 81 mg by mouth daily.    . Calcium Citrate-Vitamin D (CITRACAL + D PO) Take 400 mg by mouth daily. Vitamin C 600mg , Vitamin D 1000 units    . Carboxymethylcell-Hypromellose 0.25-0.3 % GEL Place 1 application into both eyes at bedtime.    . cholecalciferol (VITAMIN D) 1000 units tablet Take 1,000 Units by mouth daily.    . cycloSPORINE (RESTASIS) 0.05 % ophthalmic emulsion Place 1 drop into both eyes 2 (two) times daily.     Marland Kitchen estradiol (ESTRACE) 0.1 MG/GM vaginal cream Place 2 g vaginally. Every 3rd day    . hydrOXYzine (ATARAX/VISTARIL) 10 MG tablet Take 10 mg by mouth at bedtime. Patient takes at bedtime    . ibuprofen (ADVIL,MOTRIN) 200 MG tablet Take 200-400 mg by mouth every 6 (six) hours as needed. Pain    . metoprolol succinate (TOPROL XL) 25 MG 24 hr tablet Take 1 tablet (25 mg total) by mouth daily. 90 tablet 3  . Omega-3 Fatty Acids (OMEGA 3 PO) Take 1,400 mg by mouth daily.    . Probiotic Product (ALIGN PO) Take 1 tablet by mouth every other day.     . ranitidine (ZANTAC) 75 MG tablet Take 75 mg by mouth daily.      No current facility-administered medications  for this visit.     Allergies:  Allergies  Allergen Reactions  . Adhesive [Tape] Itching and Rash    Past Medical History, Surgical history, Social history, and Family History were reviewed and updated.   Physical Exam: Blood pressure 117/68, pulse 73, temperature 98.2 F (36.8 C), temperature source Oral, resp. rate 18, height 5\' 6"  (1.676 m), weight 167 lb 14.4 oz (76.2 kg), SpO2 100 %. ECOG: 0 General appearance: Well-appearing woman appeared without distress. Head: Normocephalic,  without obvious abnormality no oral ulcers or lesions. Neck: no adenopathy Lymph nodes: Cervical, supraclavicular, and axillary nodes normal. Heart:regular rate and rhythm, S1, S2 normal, no murmur, click, rub or gallop Lung:chest clear, no wheezing, rales, normal symmetric air entry no dullness to percussion. Abdomin: soft, non-tender, without masses or organomegaly no shifting dullness or ascites. EXT:no erythema, induration, or nodules   Lab Results: Lab Results  Component Value Date   WBC 3.8 (L) 10/15/2016   HGB 12.9 10/15/2016   HCT 37.6 10/15/2016   MCV 88.8 10/15/2016   PLT 272 10/15/2016     Chemistry      Component Value Date/Time   NA 140 03/04/2016 1444   NA 142 10/15/2015 0805   K 4.1 03/04/2016 1444   K 4.6 10/15/2015 0805   CL 107 03/04/2016 1444   CL 104 09/20/2012 0922   CO2 24 03/04/2016 1444   CO2 27 10/15/2015 0805   BUN 14 03/04/2016 1444   BUN 10.9 10/15/2015 0805   CREATININE 0.97 03/04/2016 1444   CREATININE 1.0 10/15/2015 0805      Component Value Date/Time   CALCIUM 8.8 03/04/2016 1444   CALCIUM 9.7 10/15/2015 0805   ALKPHOS 50 04/16/2015 1421   AST 16 04/16/2015 1421   ALT 13 04/16/2015 1421   BILITOT 0.74 04/16/2015 1421       Impression and Plan:  61 year old woman with:  1. T3N0 rectal cancer diagnosed in 2011. She is status post abdominoperineal resection for moderately differentiated mucinous adenocarcinoma on May 01, 2010. She is status post neoadjuvant 5-FU with radiation from 01/27/2010 to 03/16/2010. She began Xeloda in the adjuvant setting from July 02, 2010, to October 18, 2010. She received a total of 6 cycles.   CT scan on 10/15/2015 showed no evidence of recurrent disease.  Currently she is close to 6 years out from her cancer operation and systemic chemotherapy. I see no reason to continue oncology surveillance at this time with the risk of relapse from her cancer is fairly low. Repeat imaging studies will be done  only if neither she develops any symptoms.  2. Crampy abdominal pain: Unclear etiology but no abnormalities noted in her imaging studies. 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  3. Colonoscopy follow up: She is up-to-date with colonoscopy done in 2017. I encouraged her to continue to do so.  4. Follow-up: I'm happy to see her in the future as needed.    [facility], MD 3/1/20189:14 AM

## 2016-10-16 LAB — CEA: CEA: 1.1 ng/mL (ref 0.0–4.7)

## 2016-12-02 DIAGNOSIS — L820 Inflamed seborrheic keratosis: Secondary | ICD-10-CM | POA: Diagnosis not present

## 2016-12-02 DIAGNOSIS — D2261 Melanocytic nevi of right upper limb, including shoulder: Secondary | ICD-10-CM | POA: Diagnosis not present

## 2016-12-02 DIAGNOSIS — D2262 Melanocytic nevi of left upper limb, including shoulder: Secondary | ICD-10-CM | POA: Diagnosis not present

## 2016-12-02 DIAGNOSIS — L723 Sebaceous cyst: Secondary | ICD-10-CM | POA: Diagnosis not present

## 2016-12-02 DIAGNOSIS — L821 Other seborrheic keratosis: Secondary | ICD-10-CM | POA: Diagnosis not present

## 2016-12-18 DIAGNOSIS — H52203 Unspecified astigmatism, bilateral: Secondary | ICD-10-CM | POA: Diagnosis not present

## 2016-12-18 DIAGNOSIS — H04123 Dry eye syndrome of bilateral lacrimal glands: Secondary | ICD-10-CM | POA: Diagnosis not present

## 2016-12-18 DIAGNOSIS — H11823 Conjunctivochalasis, bilateral: Secondary | ICD-10-CM | POA: Diagnosis not present

## 2017-03-05 ENCOUNTER — Ambulatory Visit (INDEPENDENT_AMBULATORY_CARE_PROVIDER_SITE_OTHER): Payer: BLUE CROSS/BLUE SHIELD | Admitting: Internal Medicine

## 2017-03-05 ENCOUNTER — Encounter: Payer: Self-pay | Admitting: Internal Medicine

## 2017-03-05 VITALS — BP 100/60 | HR 70 | Ht 66.0 in | Wt 161.6 lb

## 2017-03-05 DIAGNOSIS — R600 Localized edema: Secondary | ICD-10-CM | POA: Diagnosis not present

## 2017-03-05 DIAGNOSIS — I471 Supraventricular tachycardia: Secondary | ICD-10-CM

## 2017-03-05 DIAGNOSIS — I83893 Varicose veins of bilateral lower extremities with other complications: Secondary | ICD-10-CM | POA: Diagnosis not present

## 2017-03-05 MED ORDER — METOPROLOL SUCCINATE ER 25 MG PO TB24: 25.0000 mg | ORAL_TABLET | Freq: Every day | ORAL | 3 refills | Status: DC

## 2017-03-05 NOTE — Patient Instructions (Signed)
Medication Instructions:  Your physician recommends that you continue on your current medications as directed. Please refer to the Current Medication list given to you today.   Labwork: none  Testing/Procedures: Your physician has requested that you have an echocardiogram. Echocardiography is a painless test that uses sound waves to create images of your heart. It provides your doctor with information about the size and shape of your heart and how well your heart's chambers and valves are working. This procedure takes approximately one hour. There are no restrictions for this procedure.    Follow-Up: Your physician wants you to follow-up in: 1 year with Dr End. (July 2019). You will receive a reminder letter in the mail two months in advance. If you don't receive a letter, please call our office to schedule the follow-up appointment.   Any Other Special Instructions Will Be Listed Below (If Applicable).  You can wear compression stockings during the day to help with your lower extremity edema.  If you need a refill on your cardiac medications before your next appointment, please call your pharmacy.

## 2017-03-05 NOTE — Progress Notes (Signed)
Follow-up Outpatient Visit Date: 03/05/2017  Primary Care Provider: Leighton Ruff, [place] [address]  Chief Complaint: Follow-up palpitations  HPI:  Colleen Kirby is a 61 y.o. year-old female with history of paroxysmal SVT, who presents for follow-up of palpitations. She was prevoiusly followed by Dr. Aundra Dubin, having last been seen in 02/2016. At that time, Dr. Aundra Dubin recommended increasing metoprolol to 25 mg daily; the patient wound up staying on 12.5 mg daily. She notes that her palpitations have been well-controlled. She occasionally notices a pauses, followed by a more forceful beat. This most often occurs when she is in bed. She has noticed some irregularity in her heartbeat, though this has not happened recently. She notes occasional right-sided chest pain that she describes as a "cramp" that lasts 1-2 minutes and does not have any associated symptoms. It occurs randomly and is not exertional. She denies shortness of breath, orthopnea, and PND. She goes to the gym 2x per week.  Colleen Kirby has chronic dependent leg swelling and varicose veins. She was seen by a vein specialist several years ago but never underwent treatment. She has occasional mild pain associated with the swelling. She has tried compression stockings but does not wear them regularly, as they are difficult to put on.  --------------------------------------------------------------------------------------------------  Cardiovascular History & Procedures: Cardiovascular Problems:  Paroxysmal SVT  Chronic leg edema  Risk Factors:  None  Cath/PCI:  None  CV Surgery:  None  EP Procedures and Devices:  Holter monitor (03/16/16): Predominantly sinus rhythm with occasional PACs and PVCs.  Non-Invasive Evaluation(s):  TTE (10/14/09): Normal LV size and wall thickness. LVEF 60%. Normal RV size and function. No significant valvular abnormalities.  Exercise stress echo (10/14/09):  Normal study.  Recent CV Pertinent Labs: Lab Results  Component Value Date   K 3.9 10/15/2016   MG 2.0 03/04/2016   BUN 15.7 10/15/2016   CREATININE 0.9 10/15/2016    Past medical and surgical history were reviewed and updated in EPIC.  Current Meds  Medication Sig  . albuterol (PROVENTIL HFA;VENTOLIN HFA) 108 (90 BASE) MCG/ACT inhaler Inhale 2 puffs into the lungs every 6 (six) hours as needed. Wheezing  . Alpha-D-Galactosidase (BEANO PO) Take 1 tablet by mouth as needed (GAS). beano  . aspirin EC 81 MG tablet Take 81 mg by mouth daily.  . Calcium Citrate-Vitamin D (CITRACAL + D PO) Take 400 mg by mouth every other day. Vitamin C 600mg , Vitamin D 1000 units  . Carboxymethylcell-Hypromellose 0.25-0.3 % GEL Place 1 application into both eyes at bedtime.  . cholecalciferol (VITAMIN D) 1000 units tablet Take 2,000 Units by mouth daily.   . cycloSPORINE (RESTASIS) 0.05 % ophthalmic emulsion Place 1 drop into both eyes 2 (two) times daily.   Marland Kitchen estradiol (ESTRACE) 0.1 MG/GM vaginal cream Place 2 g vaginally. Every 3rd day  . hydrOXYzine (ATARAX/VISTARIL) 10 MG tablet Take 10 mg by mouth at bedtime. Patient takes at bedtime  . ibuprofen (ADVIL,MOTRIN) 200 MG tablet Take 200-400 mg by mouth every 6 (six) hours as needed. Pain  . metoprolol succinate (TOPROL XL) 25 MG 24 hr tablet Take 1 tablet (25 mg total) by mouth daily.  . Omega-3 Fatty Acids (OMEGA 3 PO) Take 1,250 mg by mouth daily.   . Probiotic Product (ALIGN PO) Take 1 tablet by mouth every other day.   . ranitidine (ZANTAC) 75 MG tablet Take 75 mg by mouth daily.     Allergies: Adhesive [tape]  Social History  Social History  . Marital status: Married    Spouse name: N/A  . Number of children: 3  . Years of education: N/A   Occupational History  . Not on file.   Social History Main Topics  . Smoking status: Never Smoker  . Smokeless tobacco: Never Used  . Alcohol use No  . Drug use: Unknown  . Sexual activity: Not  on file   Other Topics Concern  . Not on file   Social History Narrative  . No narrative on file    Family History  Problem Relation Age of Onset  . Congestive Heart Failure Mother   . Hypertension Mother   . Breast cancer Mother 59  . Heart disease Father   . Hypertension Brother   . Healthy Brother   . Pneumonia Maternal Uncle        HIV related  . Colon cancer Paternal Aunt        died in her 69s  . Heart disease Maternal Grandmother   . Breast cancer Paternal Grandmother        dx in her 4s, died at 23  . Heart disease Paternal Grandfather   . Prostate cancer Cousin        paternal cousin with prostate cancer in his 27s  . Testicular cancer Son 34  . Coronary artery disease Unknown   . Arrhythmia Unknown     Review of Systems: A 12-system review of systems was performed and was negative except as noted in the HPI.  --------------------------------------------------------------------------------------------------  Physical Exam: BP 100/60   Pulse 70   Ht 5\' 6"  (1.676 m)   Wt 161 lb 9.6 oz (73.3 kg)   SpO2 97%   BMI 26.08 kg/m   General:  Well-developed, well-nourished woman, seated comfortably in the exam room. HEENT: No conjunctival pallor or scleral icterus. Moist mucous membranes.  OP clear. Neck: Supple without lymphadenopathy, thyromegaly, JVD, or HJR. No carotid bruit. Lungs: Normal work of breathing. Clear to auscultation bilaterally without wheezes or crackles. Heart: Regular rate and rhythm without murmurs, rubs, or gallops. Non-displaced PMI. Abd: Bowel sounds present. Soft, NT/ND without hepatosplenomegaly Ext: No lower extremity edema. Varicose veins noted about distal calves and ankles bilaterally. Radial, PT, and DP pulses are 2+ bilaterally. Skin: Warm and dry without rash.  EKG:  NSR without abnormalities.  Lab Results  Component Value Date   WBC 3.8 (L) 10/15/2016   HGB 12.9 10/15/2016   HCT 37.6 10/15/2016   MCV 88.8 10/15/2016    PLT 272 10/15/2016    Lab Results  Component Value Date   NA 143 10/15/2016   K 3.9 10/15/2016   CL 107 03/04/2016   CO2 27 10/15/2016   BUN 15.7 10/15/2016   CREATININE 0.9 10/15/2016   GLUCOSE 66 (L) 10/15/2016   ALT 13 10/15/2016    No results found for: CHOL, HDL, LDLCALC, LDLDIRECT, TRIG, CHOLHDL  --------------------------------------------------------------------------------------------------  ASSESSMENT AND PLAN: Paroxysmal supraventricular tachycardia Occasional palpitations noted by the patient, most consistent with PACs and PVCs noted on prior Holter monitor. Symptoms seem well-controlled on current dose of metoprolol. No medication changes at this time.  Lower extremity edema and venous insufficiency Swelling mis most likely due to venous insufficiency. We have agreed to obtain a TTE to exclude structural heart abnormality that could be underlying the edema. Labs in 10/2016 do no indicate renal or hepatic disease. I have encouraged the patient to use compression stockings. If symptoms remain concerning and TTE is unrevealing, Ms. Abner Greenspan  should follow-up with a vein specialist for further management.  Follow-up: Return to clinic in 1 year.   Nelva Bush, MD 03/05/2017 11:09 AM

## 2017-03-06 ENCOUNTER — Encounter: Payer: Self-pay | Admitting: Internal Medicine

## 2017-03-06 DIAGNOSIS — I83893 Varicose veins of bilateral lower extremities with other complications: Secondary | ICD-10-CM | POA: Insufficient documentation

## 2017-03-06 DIAGNOSIS — R600 Localized edema: Secondary | ICD-10-CM | POA: Insufficient documentation

## 2017-03-09 ENCOUNTER — Ambulatory Visit (HOSPITAL_COMMUNITY): Payer: BLUE CROSS/BLUE SHIELD | Attending: Cardiology

## 2017-03-09 ENCOUNTER — Other Ambulatory Visit: Payer: Self-pay

## 2017-03-09 DIAGNOSIS — R600 Localized edema: Secondary | ICD-10-CM | POA: Diagnosis not present

## 2017-03-09 LAB — ECHOCARDIOGRAM COMPLETE
AOASC: 29 cm
E decel time: 201 msec
EERAT: 6.03
FS: 34 % (ref 28–44)
IV/PV OW: 1.03
LA diam index: 1.64 cm/m2
LA vol: 33.2 mL
LASIZE: 30 mm
LAVOLA4C: 30.4 mL
LAVOLIN: 18.1 mL/m2
LEFT ATRIUM END SYS DIAM: 30 mm
LV PW d: 8.14 mm — AB (ref 0.6–1.1)
LV TDI E'MEDIAL: 7.58
LVEEAVG: 6.03
LVEEMED: 6.03
LVELAT: 13.7 cm/s
LVOT VTI: 20.4 cm
LVOT area: 3.14 cm2
LVOT diameter: 20 mm
LVOT peak vel: 83.6 cm/s
LVOTSV: 64 mL
Lateral S' vel: 11.4 cm/s
MV Dec: 201
MV Peak grad: 3 mmHg
MVPKAVEL: 65.1 m/s
MVPKEVEL: 82.6 m/s
TAPSE: 27.2 mm
TDI e' lateral: 13.7

## 2017-03-09 MED ORDER — PERFLUTREN LIPID MICROSPHERE
1.0000 mL | INTRAVENOUS | Status: AC | PRN
  Administered 2017-03-09: 1 mL via INTRAVENOUS

## 2017-03-15 DIAGNOSIS — C20 Malignant neoplasm of rectum: Secondary | ICD-10-CM | POA: Diagnosis not present

## 2017-03-15 DIAGNOSIS — Z933 Colostomy status: Secondary | ICD-10-CM | POA: Diagnosis not present

## 2017-03-16 DIAGNOSIS — R300 Dysuria: Secondary | ICD-10-CM | POA: Diagnosis not present

## 2017-05-26 DIAGNOSIS — Z01419 Encounter for gynecological examination (general) (routine) without abnormal findings: Secondary | ICD-10-CM | POA: Diagnosis not present

## 2017-05-26 DIAGNOSIS — Z6826 Body mass index (BMI) 26.0-26.9, adult: Secondary | ICD-10-CM | POA: Diagnosis not present

## 2017-05-28 ENCOUNTER — Other Ambulatory Visit: Payer: Self-pay | Admitting: Cardiology

## 2017-05-28 DIAGNOSIS — R002 Palpitations: Secondary | ICD-10-CM

## 2017-05-28 NOTE — Telephone Encounter (Signed)
Medication Detail    Disp Refills Start End   metoprolol succinate (TOPROL XL) 25 MG 24 hr tablet 90 tablet 3 03/05/2017    Sig - Route: Take 1 tablet (25 mg total) by mouth daily. - Oral   Sent to pharmacy as: metoprolol succinate (TOPROL XL) 25 MG 24 hr tablet   E-Prescribing Status: Receipt confirmed by pharmacy (03/05/2017 11:50 AM EDT)   Associated Diagnoses   Paroxysmal SVT (supraventricular tachycardia) ([place]) - Primary     Pharmacy   [id number] - [place], [place]

## 2017-06-25 DIAGNOSIS — E559 Vitamin D deficiency, unspecified: Secondary | ICD-10-CM | POA: Diagnosis not present

## 2017-06-25 DIAGNOSIS — E7800 Pure hypercholesterolemia, unspecified: Secondary | ICD-10-CM | POA: Diagnosis not present

## 2017-06-29 DIAGNOSIS — Z23 Encounter for immunization: Secondary | ICD-10-CM | POA: Diagnosis not present

## 2017-06-29 DIAGNOSIS — Z0000 Encounter for general adult medical examination without abnormal findings: Secondary | ICD-10-CM | POA: Diagnosis not present

## 2017-07-06 DIAGNOSIS — N3946 Mixed incontinence: Secondary | ICD-10-CM | POA: Diagnosis not present

## 2017-07-06 DIAGNOSIS — N3040 Irradiation cystitis without hematuria: Secondary | ICD-10-CM | POA: Diagnosis not present

## 2017-08-27 ENCOUNTER — Other Ambulatory Visit: Payer: Self-pay | Admitting: Obstetrics & Gynecology

## 2017-08-27 DIAGNOSIS — Z1231 Encounter for screening mammogram for malignant neoplasm of breast: Secondary | ICD-10-CM

## 2017-09-16 ENCOUNTER — Ambulatory Visit
Admission: RE | Admit: 2017-09-16 | Discharge: 2017-09-16 | Disposition: A | Discharge status: Home / Self Care | Payer: BLUE CROSS/BLUE SHIELD | Source: Ambulatory Visit | Attending: Obstetrics & Gynecology | Admitting: Obstetrics & Gynecology

## 2017-09-16 DIAGNOSIS — Z1231 Encounter for screening mammogram for malignant neoplasm of breast: Secondary | ICD-10-CM

## 2017-11-08 DIAGNOSIS — Z933 Colostomy status: Secondary | ICD-10-CM | POA: Diagnosis not present

## 2017-11-08 DIAGNOSIS — C20 Malignant neoplasm of rectum: Secondary | ICD-10-CM | POA: Diagnosis not present

## 2017-11-09 DIAGNOSIS — R87610 Atypical squamous cells of undetermined significance on cytologic smear of cervix (ASC-US): Secondary | ICD-10-CM | POA: Diagnosis not present

## 2018-02-22 DIAGNOSIS — H04123 Dry eye syndrome of bilateral lacrimal glands: Secondary | ICD-10-CM | POA: Diagnosis not present

## 2018-02-22 DIAGNOSIS — H52203 Unspecified astigmatism, bilateral: Secondary | ICD-10-CM | POA: Diagnosis not present

## 2018-02-22 DIAGNOSIS — H2513 Age-related nuclear cataract, bilateral: Secondary | ICD-10-CM | POA: Diagnosis not present

## 2018-02-24 ENCOUNTER — Telehealth: Payer: Self-pay | Admitting: *Deleted

## 2018-02-24 ENCOUNTER — Ambulatory Visit: Payer: BLUE CROSS/BLUE SHIELD | Admitting: Internal Medicine

## 2018-02-24 ENCOUNTER — Encounter: Payer: Self-pay | Admitting: Internal Medicine

## 2018-02-24 VITALS — BP 116/70 | HR 75 | Ht 66.0 in | Wt 166.6 lb

## 2018-02-24 DIAGNOSIS — R0683 Snoring: Secondary | ICD-10-CM

## 2018-02-24 DIAGNOSIS — I872 Venous insufficiency (chronic) (peripheral): Secondary | ICD-10-CM

## 2018-02-24 DIAGNOSIS — I471 Supraventricular tachycardia: Secondary | ICD-10-CM | POA: Diagnosis not present

## 2018-02-24 DIAGNOSIS — R5383 Other fatigue: Secondary | ICD-10-CM

## 2018-02-24 DIAGNOSIS — R0600 Dyspnea, unspecified: Secondary | ICD-10-CM | POA: Diagnosis not present

## 2018-02-24 NOTE — Progress Notes (Signed)
Follow-up Outpatient Visit Date: 02/24/2018  Primary Care Provider: Leighton Ruff, [place] [address]  Chief Complaint: Palpitations and snoring  HPI:  Colleen Kirby is a 62 y.o. year-old female with history of PSVT, who presents for follow-up of palpitations.  I last saw her a year ago, at which time she reported that her palpitations were fairly well controlled.  She noted only rare brief palpitations and brief cramp-like chest pain on the right side.  Due to progressive leg swelling, we agreed to obtain a transthoracic echocardiogram, which was normal.  Edema was attributed to venous insufficiency and has been minimal.  Today, Colleen Kirby is without complaints.  She notes only rare palpitations.  She never increase metoprolol last year, she felt like her symptoms were minimal.  She is tolerating metoprolol succinate 12.5 mg daily well.  She denies chest pain, shortness of breath, lightheadedness, and edema.  Only concern is that her husband has noted that Colleen Kirby snores regularly.  She also has sporadic episodes of PND.  She typically does not feel well rested in the mornings, though she attributes this to not being a morning person.  She does not nap or feel overly fatigued during the daytime.  She has never been checked for sleep apnea.  --------------------------------------------------------------------------------------------------  Cardiovascular History & Procedures: Cardiovascular Problems:  Paroxysmal SVT  Chronic leg edema  Risk Factors:  None  Cath/PCI:  None  CV Surgery:  None  EP Procedures and Devices:  Holter monitor (03/16/16): Predominantly sinus rhythm with occasional PACs and PVCs.  Non-Invasive Evaluation(s):  TTE (03/09/17): Normal LV size.  LVEF 55 6% with normal wall motion and diastolic function.  Normal RV size and function.  No significant valvular abnormalities.  TTE (10/14/09): Normal LV size and wall  thickness. LVEF 60%. Normal RV size and function. No significant valvular abnormalities.  Exercise stress echo (10/14/09): Normal study.  Recent CV Pertinent Labs: Lab Results  Component Value Date   K 3.9 10/15/2016   MG 2.0 03/04/2016   BUN 15.7 10/15/2016   CREATININE 0.9 10/15/2016    Past medical and surgical history were reviewed and updated in EPIC.  Current Meds  Medication Sig  . albuterol (PROVENTIL HFA;VENTOLIN HFA) 108 (90 BASE) MCG/ACT inhaler Inhale 2 puffs into the lungs every 6 (six) hours as needed. Wheezing  . Alpha-D-Galactosidase (BEANO PO) Take 1 tablet by mouth as needed (GAS). beano  . aspirin EC 81 MG tablet Take 81 mg by mouth daily.  . Calcium Citrate-Vitamin D (CITRACAL + D PO) Take 400 mg by mouth every other day. Vitamin C 600mg , Vitamin D 1000 units  . Carboxymethylcell-Hypromellose 0.25-0.3 % GEL Place 1 application into both eyes at bedtime.  . cholecalciferol (VITAMIN D) 1000 units tablet Take 2,000 Units by mouth daily.   . cycloSPORINE (RESTASIS) 0.05 % ophthalmic emulsion Place 1 drop into both eyes 2 (two) times daily.   Marland Kitchen estradiol (ESTRACE) 0.1 MG/GM vaginal cream Place 2 g vaginally. Every 3rd day  . hydrOXYzine (ATARAX/VISTARIL) 10 MG tablet Take 10 mg by mouth at bedtime. Patient takes at bedtime  . ibuprofen (ADVIL,MOTRIN) 200 MG tablet Take 200-400 mg by mouth every 6 (six) hours as needed. Pain  . metoprolol succinate (TOPROL XL) 25 MG 24 hr tablet Take 1 tablet (25 mg total) by mouth daily. (Patient taking differently: Take 12.5 mg by mouth daily. )  . Omega-3 Fatty Acids (OMEGA 3 PO) Take 1,250 mg by mouth daily.   Marland Kitchen  Probiotic Product (ALIGN PO) Take 1 tablet by mouth every other day.   . ranitidine (ZANTAC) 75 MG tablet Take 75 mg by mouth daily.     Allergies: Adhesive [tape]  Social History   Tobacco Use  . Smoking status: Never Smoker  . Smokeless tobacco: Never Used  Substance Use Topics  . Alcohol use: No  . Drug use: Not  on file    Family History  Problem Relation Age of Onset  . Congestive Heart Failure Mother   . Hypertension Mother   . Breast cancer Mother 63  . Heart disease Father   . Hypertension Brother   . Healthy Brother   . Pneumonia Maternal Uncle        HIV related  . Colon cancer Paternal Aunt        died in her 76s  . Heart disease Maternal Grandmother   . Breast cancer Paternal Grandmother        dx in her 74s, died at 37  . Heart disease Paternal Grandfather   . Prostate cancer Cousin        paternal cousin with prostate cancer in his 34s  . Testicular cancer Son 3  . Coronary artery disease Unknown   . Arrhythmia Unknown     Review of Systems: A 12-system review of systems was performed and was negative except as noted in the HPI.  --------------------------------------------------------------------------------------------------  Physical Exam: BP 116/70   Pulse 75   Ht 5\' 6"  (1.676 m)   Wt 166 lb 9.6 oz (75.6 kg)   SpO2 98%   BMI 26.89 kg/m   General: NAD. HEENT: No conjunctival pallor or scleral icterus. Moist mucous membranes.  OP clear. Neck: Supple without lymphadenopathy, thyromegaly, JVD, or HJR. Lungs: Normal work of breathing. Clear to auscultation bilaterally without wheezes or crackles. Heart: Regular rate and rhythm without murmurs, rubs, or gallops. Non-displaced PMI. Abd: Bowel sounds present. Soft, NT/ND without hepatosplenomegaly Ext: Varicose veins noted in both legs.  No lower extremity edema. Radial, PT, and DP pulses are 2+ bilaterally. Skin: Warm and dry without rash.  EKG: Normal sinus rhythm without abnormalities.  Lab Results  Component Value Date   WBC 3.8 (L) 10/15/2016   HGB 12.9 10/15/2016   HCT 37.6 10/15/2016   MCV 88.8 10/15/2016   PLT 272 10/15/2016    Lab Results  Component Value Date   NA 143 10/15/2016   K 3.9 10/15/2016   CL 107 03/04/2016   CO2 27 10/15/2016   BUN 15.7 10/15/2016   CREATININE 0.9 10/15/2016    GLUCOSE 66 (L) 10/15/2016   ALT 13 10/15/2016    No results found for: CHOL, HDL, LDLCALC, LDLDIRECT, TRIG, CHOLHDL  --------------------------------------------------------------------------------------------------  ASSESSMENT AND PLAN: PSVT Symptoms well controlled with current dose of metoprolol.  Patient did not wish to increase this further after our last visit, his symptoms were minimal.  Continue indefinite metoprolol succinate 12.5 mg daily.  Snoring, fatigue, and PND Symptoms are nonspecific but certainly could be associated with obstructive sleep apnea.  We have agreed to obtain a sleep study for further evaluation.  Extremity venous insufficiency Minimal edema noted by patient.  I encouraged her to elevate her legs when possible and to use over-the-counter compression stockings as needed.  Follow-up: Return to clinic in 1 year.  Nelva Bush, MD 02/24/2018 11:40 AM

## 2018-02-24 NOTE — Telephone Encounter (Signed)
Staff message sent to [place] denied in lab sleep study. Approved HST. Place order and schedule.

## 2018-02-24 NOTE — Patient Instructions (Addendum)
Medication Instructions:  Your physician recommends that you continue on your current medications as directed. Please refer to the Current Medication list given to you today.  Labwork: None ordered     *We will only notify you of abnormal results, otherwise continue current treatment plan.  Testing/Procedures: Your physician has recommended that you have a sleep study. This test records several body functions during sleep, including: brain activity, eye movement, oxygen and carbon dioxide blood levels, heart rate and rhythm, breathing rate and rhythm, the flow of air through your mouth and nose, snoring, body muscle movements, and chest and belly movement.  *The office will contact you to arrange once it has been pre-certified with your  insurance.  Follow-Up: Your physician wants you to follow-up in: 1 year with Dr. Saunders Revel.  You will receive a reminder letter in the mail two months in advance. If you don't receive a letter, please call our office to schedule the follow-up appointment.   * If you need a refill on your cardiac medications before your next appointment, please call your pharmacy.   *Please note that any paperwork needing to be filled out by the provider will need to be addressed at the front desk prior to seeing the provider. Please note that any FMLA, disability or other documents regarding health condition is subject to a $25.00 charge that must be received prior to completion of paperwork in the form of a money order or check.  Thank you for choosing [facility]!!    Any Other Special Instructions Will Be Listed Below (If Applicable).   Sleep Studies A sleep study (polysomnogram) is a series of tests done while you are sleeping. It can show how well you sleep. This can help your health care provider diagnose a sleep disorder and show how severe your sleep disorder is. A sleep study may lead to treatment that will help you sleep better and prevent other medical problems  caused by poor sleep. If you have a sleep disorder, you may also be at risk for:  Sleep-related accidents.  High blood pressure.  Heart disease.  Stroke.  Other medical conditions.  Sleep disorders are common. Your health care provider may suspect a sleep disorder if you:  Have loud snoring most nights.  Have brief periods when you stop breathing at night.  Feel sleepy on most days.  Fall asleep suddenly during the day.  Have trouble falling asleep or staying asleep.  Feel like you need to move your legs when trying to fall asleep.  Have dreams that seem very real shortly after falling asleep.  Feel like you cannot move when you first wake up.  Which tests will I need to have? Most sleep studies last all night and include these tests:  Recordings of your brain activity.  Recordings of your eye movements.  Recording of your heart rate and rhythm.  Blood pressure readings.  Readings of the amount of oxygen in your blood.  Measurements of your chest and belly movement as you breathe during sleep.  If you have signs of the sleep disorder called sleep apnea during your test, you may get a mask to wear for the second half of the night.  The mask provides continuous positive airway pressure (CPAP). This may improve sleep apnea significantly.  You will then have all tests done again with the mask in place to see if your measurements and recordings change.  How are sleep studies done? Most sleep studies are done over one full night  of sleep.  You will arrive at the study center in the evening and can go home in the morning.  Bring your pajamas and toothbrush.  Do not have caffeine on the day of your sleep study.  Your health care provider will let you know if you need to stop taking any of your regular medicines before the test.  To do the tests included in a polysomnogram, you will have:  Round, sticky patches with sensors attached to recording wires  (electrodes) placed on your scalp, face, chest, and limbs.  Wires from all the electrodes and sensors run from your bed to a computer. The wires can be taken off and put back on if you need to get out of bed to go to the bathroom.  A sensor placed over your nose to measure airflow.  A finger clip put on one finger to measure your blood oxygen level.  A belt around your belly and a belt around your chest to measure breathing movements.  Where are sleep studies done? Sleep studies are done at sleep centers. A sleep center may be inside a hospital, office, or clinic. The room where you have the study may look like a hospital room or a hotel room. The health care providers doing the study may come in and out of the room during the study. Most of the time, they will be in another room monitoring your test. How is information from sleep studies helpful? A polysomnogram can be used along with your medical history and a physical exam to diagnose conditions, such as:  Sleep apnea.  Restless legs syndrome.  Sleep-related seizure disorders.  Sleep-related movement disorders.  A medical doctor who specializes in sleep will evaluate your sleep study. The specialist will share the results with your primary health care provider. Treatments based on your sleep study may include:  Improving your sleep habits (sleep hygiene).  Wearing a CPAP mask.  Wearing an oral device at night to improve breathing and reduce snoring.  Taking medicine for: ? Restless legs syndrome. ? Sleep-related seizure disorder. ? Sleep-related movement disorder.  This information is not intended to replace advice given to you by your health care provider. Make sure you discuss any questions you have with your health care provider. Document Released: 02/07/2003 Document Revised: 03/29/2016 Document Reviewed: 10/09/2013 Elsevier Interactive Patient Education  2018 [place]  scheduled for a sleep study on _________ at ________. You will leave next morning between 5:00 a.m - 7:00 a.m.  The sleep study consists of a recording of your brain waves (EEG). Breathing, heart rate and rhythm (ECG), oxygen level, eye movement, and leg movement.  The technician will glue or or paste several electrodes to your scalp, face, chest and legs.  You will have belts around your chest and abdomen to record breathing and a finger clasp to check blood oxygen levels.  A tube at your mouth and nose will detect airflow.  There are no needle sticks or painful procedures of any sort.  You will have your own room, and we will make every effort to attend to your comfort and privacy.  Please prepare for your study by the following steps:   Please avoid coffee, tea, soda, chocolate and other caffeine foods or beverages after 12:00 noon on the day of your sleep study.   You must arrive with clean (no oils), conditioners or make up, and please make sure that you wash your  hair to ensure that your hair and scalp are clean, dry and free of any hair extensions on the day of your study.  This will help to get a good reading of study.  Please try not to nap on the day of your study.  Please bring a list of all your medications.  Bring any medications that you might need during the time you are within the laboratory, including insulin, sleeping pills, pain medication and anxiety medications.  Bring snacks, water or juice  Please bring clothes to sleep in and your normal overnight bag.  Please leave valuable at home, as we will not be responsible for any lost items.  If you have any further questions, please feel free to call our office. Thank you  *Please call our office 48 in advance to cancel or reschedule to avoid a $100.00 early cancel or no show fee*

## 2018-02-24 NOTE — Telephone Encounter (Signed)
-----   Message from Stanton Kidney, RN sent at 02/24/2018 10:25 AM EDT ----- Regarding: Sleep Study Please arrange sleep study Dx: Paroxysmal nocturnal dyspnea, snoring, daytime somnolence

## 2018-03-18 ENCOUNTER — Other Ambulatory Visit: Payer: Self-pay | Admitting: *Deleted

## 2018-03-18 ENCOUNTER — Telehealth: Payer: Self-pay | Admitting: *Deleted

## 2018-03-18 DIAGNOSIS — R0683 Snoring: Secondary | ICD-10-CM

## 2018-03-18 DIAGNOSIS — R5383 Other fatigue: Secondary | ICD-10-CM

## 2018-03-18 NOTE — Telephone Encounter (Signed)
-----   Message from Lauralee Evener, [place] sent at 02/24/2018  1:58 PM EDT ----- Regarding: RE: Sleep Study [employer] denied in lab sleep study, however they did approve a home sleep study. Please place order in computer and call [phone] to schedule. Thanks!  ----- Message ----- From: Stanton Kidney, RN Sent: 02/24/2018  10:25 AM To: Frederik Schmidt, RN, Cv Div Sleep Studies Subject: Sleep Study                                    Please arrange sleep study Dx: Paroxysmal nocturnal dyspnea, snoring, daytime somnolence

## 2018-03-18 NOTE — Progress Notes (Signed)
error 

## 2018-03-18 NOTE — Telephone Encounter (Signed)
Home sleep study order placed in Epic. Will forward to sleep study assistant to arrange home study.

## 2018-03-22 ENCOUNTER — Telehealth: Payer: Self-pay | Admitting: *Deleted

## 2018-03-22 NOTE — Telephone Encounter (Signed)
Patient is aware and agreeable to Home Sleep Study through [facility]. Patient is scheduled for WED 04/13/18 at 2:30 pm to pick up home sleep kit and meet with Respiratory therapist at [facility]. Patient is aware that if this appointment date and time does not work for them they should contact [facility] directly at [phone].  Patient is aware that a sleep packet will be sent from [facility] in week. Patient is agreeable to treatment and thankful for call    

## 2018-03-22 NOTE — Telephone Encounter (Signed)
-----   Message from Lauralee Evener, [place] sent at 02/24/2018  1:58 PM EDT ----- Regarding: RE: Sleep Study [employer] denied in lab sleep study, however they did approve a home sleep study. Please place order in computer and call [phone] to schedule. Thanks  From: Stanton Kidney, RN Sent: 02/24/2018  10:25 AM To: Frederik Schmidt, RN, Cv Div Sleep Studies Subject: Sleep Study                                    Please arrange sleep study Dx: Paroxysmal nocturnal dyspnea, snoring, daytime somnolence

## 2018-03-23 NOTE — Telephone Encounter (Signed)
Patient is aware and agreeable to Home Sleep Study through [facility]. Patient is scheduled for WED 04/13/18 at 2:30 pm to pick up home sleep kit and meet with Respiratory therapist at [facility]. Patient is aware that if this appointment date and time does not work for them they should contact Artis Delay directly at [phone].  Patient is aware that a sleep packet will be sent from [facility] in week. Patient is agreeable to treatment and thankful for call

## 2018-04-13 ENCOUNTER — Encounter (HOSPITAL_BASED_OUTPATIENT_CLINIC_OR_DEPARTMENT_OTHER): Payer: BLUE CROSS/BLUE SHIELD

## 2018-04-19 DIAGNOSIS — C20 Malignant neoplasm of rectum: Secondary | ICD-10-CM | POA: Diagnosis not present

## 2018-04-19 DIAGNOSIS — Z933 Colostomy status: Secondary | ICD-10-CM | POA: Diagnosis not present

## 2018-05-04 ENCOUNTER — Encounter (HOSPITAL_BASED_OUTPATIENT_CLINIC_OR_DEPARTMENT_OTHER): Payer: BLUE CROSS/BLUE SHIELD

## 2018-05-17 NOTE — Telephone Encounter (Signed)
Patient has rescheduled her appointment to 05/23/18 at 12 p.

## 2018-05-23 ENCOUNTER — Encounter (HOSPITAL_BASED_OUTPATIENT_CLINIC_OR_DEPARTMENT_OTHER): Payer: BLUE CROSS/BLUE SHIELD

## 2018-05-30 DIAGNOSIS — Z01419 Encounter for gynecological examination (general) (routine) without abnormal findings: Secondary | ICD-10-CM | POA: Diagnosis not present

## 2018-05-30 DIAGNOSIS — Z6827 Body mass index (BMI) 27.0-27.9, adult: Secondary | ICD-10-CM | POA: Diagnosis not present

## 2018-06-02 ENCOUNTER — Other Ambulatory Visit: Payer: Self-pay | Admitting: Obstetrics & Gynecology

## 2018-06-02 DIAGNOSIS — Z1231 Encounter for screening mammogram for malignant neoplasm of breast: Secondary | ICD-10-CM

## 2018-06-21 ENCOUNTER — Encounter (HOSPITAL_BASED_OUTPATIENT_CLINIC_OR_DEPARTMENT_OTHER): Payer: Self-pay

## 2018-06-21 ENCOUNTER — Ambulatory Visit (HOSPITAL_BASED_OUTPATIENT_CLINIC_OR_DEPARTMENT_OTHER): Payer: BLUE CROSS/BLUE SHIELD

## 2018-06-21 ENCOUNTER — Encounter (HOSPITAL_BASED_OUTPATIENT_CLINIC_OR_DEPARTMENT_OTHER): Payer: BLUE CROSS/BLUE SHIELD

## 2018-06-23 DIAGNOSIS — J029 Acute pharyngitis, unspecified: Secondary | ICD-10-CM | POA: Diagnosis not present

## 2018-06-25 DIAGNOSIS — J069 Acute upper respiratory infection, unspecified: Secondary | ICD-10-CM | POA: Diagnosis not present

## 2018-06-29 DIAGNOSIS — E7800 Pure hypercholesterolemia, unspecified: Secondary | ICD-10-CM | POA: Diagnosis not present

## 2018-06-29 DIAGNOSIS — K219 Gastro-esophageal reflux disease without esophagitis: Secondary | ICD-10-CM | POA: Diagnosis not present

## 2018-06-29 DIAGNOSIS — E559 Vitamin D deficiency, unspecified: Secondary | ICD-10-CM | POA: Diagnosis not present

## 2018-07-05 DIAGNOSIS — Z0000 Encounter for general adult medical examination without abnormal findings: Secondary | ICD-10-CM | POA: Diagnosis not present

## 2018-07-05 DIAGNOSIS — Z23 Encounter for immunization: Secondary | ICD-10-CM | POA: Diagnosis not present

## 2018-07-08 DIAGNOSIS — R490 Dysphonia: Secondary | ICD-10-CM | POA: Diagnosis not present

## 2018-07-13 DIAGNOSIS — N3040 Irradiation cystitis without hematuria: Secondary | ICD-10-CM | POA: Diagnosis not present

## 2018-07-13 DIAGNOSIS — N3946 Mixed incontinence: Secondary | ICD-10-CM | POA: Diagnosis not present

## 2018-08-01 ENCOUNTER — Emergency Department (HOSPITAL_COMMUNITY)
Admission: EM | Admit: 2018-08-01 | Discharge: 2018-08-02 | Disposition: A | Discharge status: Home / Self Care | Payer: BLUE CROSS/BLUE SHIELD | Attending: Emergency Medicine | Admitting: Emergency Medicine

## 2018-08-01 ENCOUNTER — Encounter (HOSPITAL_COMMUNITY): Payer: Self-pay | Admitting: Emergency Medicine

## 2018-08-01 DIAGNOSIS — Z7982 Long term (current) use of aspirin: Secondary | ICD-10-CM | POA: Insufficient documentation

## 2018-08-01 DIAGNOSIS — R112 Nausea with vomiting, unspecified: Secondary | ICD-10-CM | POA: Insufficient documentation

## 2018-08-01 DIAGNOSIS — Z79899 Other long term (current) drug therapy: Secondary | ICD-10-CM | POA: Diagnosis not present

## 2018-08-01 DIAGNOSIS — Z85038 Personal history of other malignant neoplasm of large intestine: Secondary | ICD-10-CM | POA: Diagnosis not present

## 2018-08-01 DIAGNOSIS — R1084 Generalized abdominal pain: Secondary | ICD-10-CM

## 2018-08-01 DIAGNOSIS — Z933 Colostomy status: Secondary | ICD-10-CM | POA: Insufficient documentation

## 2018-08-01 LAB — CBC
HCT: 39.7 % (ref 36.0–46.0)
Hemoglobin: 13.3 g/dL (ref 12.0–15.0)
MCH: 29.2 pg (ref 26.0–34.0)
MCHC: 33.5 g/dL (ref 30.0–36.0)
MCV: 87.3 fL (ref 80.0–100.0)
Platelets: 293 10*3/uL (ref 150–400)
RBC: 4.55 MIL/uL (ref 3.87–5.11)
RDW: 12.3 % (ref 11.5–15.5)
WBC: 10 10*3/uL (ref 4.0–10.5)
nRBC: 0 % (ref 0.0–0.2)

## 2018-08-01 LAB — COMPREHENSIVE METABOLIC PANEL
ALT: 21 U/L (ref 0–44)
AST: 24 U/L (ref 15–41)
Albumin: 4.2 g/dL (ref 3.5–5.0)
Alkaline Phosphatase: 52 U/L (ref 38–126)
Anion gap: 15 (ref 5–15)
BUN: 15 mg/dL (ref 8–23)
CO2: 22 mmol/L (ref 22–32)
Calcium: 9.8 mg/dL (ref 8.9–10.3)
Chloride: 102 mmol/L (ref 98–111)
Creatinine, Ser: 1.01 mg/dL — ABNORMAL HIGH (ref 0.44–1.00)
GFR calc Af Amer: >60 mL/min (ref >60)
GFR calc non Af Amer: 59 mL/min — ABNORMAL LOW (ref >60)
Glucose, Bld: 151 mg/dL — ABNORMAL HIGH (ref 70–99)
Potassium: 3.3 mmol/L — ABNORMAL LOW (ref 3.5–5.1)
Sodium: 139 mmol/L (ref 135–145)
Total Bilirubin: 0.8 mg/dL (ref 0.3–1.2)
Total Protein: 7.3 g/dL (ref 6.5–8.1)

## 2018-08-01 LAB — LIPASE, BLOOD: Lipase: 29 U/L (ref 11–51)

## 2018-08-01 MED ORDER — ONDANSETRON 4 MG PO TBDP
4.0000 mg | ORAL_TABLET | Freq: Once | ORAL | Status: AC
  Administered 2018-08-01: 4 mg via ORAL
  Filled 2018-08-01: qty 1

## 2018-08-01 NOTE — ED Triage Notes (Signed)
Pt reports abdominal cramping starting around 3pm followed by nausea.  Pt reports the abdominal pain started after she ate a candy bar that was a year past the expiration date.

## 2018-08-02 LAB — URINALYSIS, ROUTINE W REFLEX MICROSCOPIC
Bacteria, UA: NONE SEEN
Bilirubin Urine: NEGATIVE
GLUCOSE, UA: NEGATIVE mg/dL
Hgb urine dipstick: NEGATIVE
Ketones, ur: 80 mg/dL — AB
Leukocytes, UA: NEGATIVE
NITRITE: NEGATIVE
PH: 9 — AB (ref 5.0–8.0)
Protein, ur: 30 mg/dL — AB
Specific Gravity, Urine: 1.028 (ref 1.005–1.030)

## 2018-08-02 NOTE — ED Provider Notes (Signed)
[facility] EMERGENCY DEPARTMENT Provider Note   CSN: [id number] Arrival date & time: 08/01/18  2206     History   Chief Complaint Chief Complaint  Patient presents with  . Nausea  . Abdominal Pain    HPI Colleen Kirby is a 62 y.o. female who presents with abdominal pain. PMH significant for colon cancer s/p colostomy and chemoradiation, GERD, hx of DVT. She states that yesterday she ate a fig bar that was expired and she started to have some abdominal cramping over her colostomy. Last night after dinner the pain became much worse. It was a constant, "churning and cramping". She started shaking because of the pain and having dry heaves. She also noticed she wasn't having any output in to her colostomy. She googled her symptoms and it described a bowel blockage so they decided to come to the ED. She had an 8 hour wait due to holding in the ED and around 3AM she started to feel better and having output in her colostomy again. Her symptoms are now resolved. She denies any further nausea. No fevers, chest pain, SOB, back pain, urinary symptoms. She reports some chronic intermittent abdominal cramping that is mild but has not had anything like this.    HPI  Past Medical History:  Diagnosis Date  . Atypical chest pain   . 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 Family history of breast cancer   . Family history of colon cancer   . Family history of testicular cancer   . GERD (gastroesophageal reflux disease)   . History of DVT (deep vein thrombosis)   . Palpitations   . Rectal cancer [facility])     Patient Active Problem List   Diagnosis Date Noted  . Paroxysmal SVT (supraventricular tachycardia) ([place]) 02/24/2018  . PND (paroxysmal nocturnal dyspnea) 02/24/2018  . Snoring 02/24/2018  . Fatigue 02/24/2018  . Venous insufficiency 02/24/2018  . Varicose veins of both legs with edema 03/06/2017  . Lower extremity edema 03/06/2017  . Family history of  colon cancer   . Family history of testicular cancer   . Family history of breast cancer   . Colon cancer ([place])   . ADD 09/19/2009  . PALPITATIONS 09/19/2009  . Undiagnosed cardiac murmurs 09/19/2009  . Chest pain, unspecified 09/19/2009    History reviewed. No pertinent surgical history.   OB History   No obstetric history on file.      Home Medications    Prior to Admission medications   Medication Sig Start Date End Date Taking? Authorizing Provider  albuterol (PROVENTIL HFA;VENTOLIN HFA) 108 (90 BASE) MCG/ACT inhaler Inhale 2 puffs into the lungs every 6 (six) hours as needed. Wheezing    [provider]  Alpha-D-Galactosidase (BEANO PO) Take 1 tablet by mouth as needed (GAS). beano    [provider]  aspirin EC 81 MG tablet Take 81 mg by mouth daily.    [provider]  Calcium Citrate-Vitamin D (CITRACAL + D PO) Take 400 mg by mouth every other day. Vitamin C 600mg , Vitamin D 1000 units    [provider]  Carboxymethylcell-Hypromellose 0.25-0.3 % GEL Place 1 application into both eyes at bedtime.    [provider]  cholecalciferol (VITAMIN D) 1000 units tablet Take 2,000 Units by mouth daily.     [provider]  cycloSPORINE (RESTASIS) 0.05 % ophthalmic emulsion Place 1 drop into both eyes 2 (two) times daily.     [provider]  estradiol (ESTRACE) 0.1 MG/GM vaginal cream Place 2 g vaginally. Every 3rd day    [provider]  hydrOXYzine (ATARAX/VISTARIL) 10 MG tablet Take 10 mg by mouth at bedtime. Patient takes at bedtime    [provider]  ibuprofen (ADVIL,MOTRIN) 200 MG tablet Take 200-400 mg by mouth every 6 (six) hours as needed. Pain    [provider]  metoprolol succinate (TOPROL XL) 25 MG 24 hr tablet Take 1 tablet (25 mg total) by mouth daily. Patient taking differently: Take 12.5 mg by mouth daily.  03/05/17   End, Harrell Gave, MD  Omega-3 Fatty Acids (OMEGA 3 PO) Take  1,250 mg by mouth daily.     [provider]  Probiotic Product (ALIGN PO) Take 1 tablet by mouth every other day.     [provider]  ranitidine (ZANTAC) 75 MG tablet Take 75 mg by mouth daily.  10/15/16   [provider]    Family History Family History  Problem Relation Age of Onset  . Congestive Heart Failure Mother   . Hypertension Mother   . Breast cancer Mother 52  . Heart disease Father   . Hypertension Brother   . Healthy Brother   . Pneumonia Maternal Uncle        HIV related  . Colon cancer Paternal Aunt        died in her 46s  . Heart disease Maternal Grandmother   . Breast cancer Paternal Grandmother        dx in her 80s, died at 44  . Heart disease Paternal Grandfather   . Prostate cancer Cousin        paternal cousin with prostate cancer in his 75s  . Testicular cancer Son 32  . Coronary artery disease Other   . Arrhythmia Other     Social History Social History   Tobacco Use  . Smoking status: Never Smoker  . Smokeless tobacco: Never Used  Substance Use Topics  . Alcohol use: No  . Drug use: Not on file     Allergies   Adhesive [tape]   Review of Systems Review of Systems  Constitutional: Negative for fever.  Respiratory: Negative for shortness of breath.   Cardiovascular: Negative for chest pain.  Gastrointestinal: Positive for abdominal pain, nausea and vomiting. Negative for constipation and diarrhea.  Genitourinary: Negative for difficulty urinating and dysuria.  All other systems reviewed and are negative.    Physical Exam Updated Vital Signs BP 124/74 (BP Location: Right Arm)   Pulse 93   Temp 98.7 F (37.1 C) (Oral)   Resp 16   SpO2 100%   Physical Exam Vitals signs and nursing note reviewed.  Constitutional:      General: She is not in acute distress.    Appearance: She is well-developed. She is not ill-appearing.  HENT:     Head: Normocephalic and atraumatic.  Eyes:     General: No scleral  icterus.       Right eye: No discharge.        Left eye: No discharge.     Conjunctiva/sclera: Conjunctivae normal.     Pupils: Pupils are equal, round, and reactive to light.  Neck:     Musculoskeletal: Normal range of motion.  Cardiovascular:     Rate and Rhythm: Normal rate and regular rhythm.  Pulmonary:     Effort: Pulmonary effort is normal. No respiratory distress.     Breath sounds: Normal breath sounds.  Abdominal:  General: Abdomen is flat. Bowel sounds are normal. There is no distension.     Palpations: Abdomen is soft.     Tenderness: There is no abdominal tenderness.     Hernia: A hernia (over colostomy) is present.     Comments: Colostomy bag  Skin:    General: Skin is warm and dry.  Neurological:     Mental Status: She is alert and oriented to person, place, and time.  Psychiatric:        Behavior: Behavior normal.      ED Treatments / Results  Labs (all labs ordered are listed, but only abnormal results are displayed) Labs Reviewed  COMPREHENSIVE METABOLIC PANEL - Abnormal; Notable for the following components:      Result Value   Potassium 3.3 (*)    Glucose, Bld 151 (*)    Creatinine, Ser 1.01 (*)    GFR calc non Af Amer 59 (*)    All other components within normal limits  URINALYSIS, ROUTINE W REFLEX MICROSCOPIC - Abnormal; Notable for the following components:   pH 9.0 (*)    Ketones, ur 80 (*)    Protein, ur 30 (*)    All other components within normal limits  LIPASE, BLOOD  CBC    EKG None  Radiology No results found.  Procedures Procedures (including critical care time)  Medications Ordered in ED Medications  ondansetron (ZOFRAN-ODT) disintegrating tablet 4 mg (4 mg Oral Given 08/01/18 2246)    Initial Impression / Assessment and Plan / ED Course  I have reviewed the triage vital signs and the nursing notes.  Pertinent labs & imaging results that were available during my care of the patient were reviewed by me and considered  in my medical decision making (see chart for details).  62 year old female presents with abdominal pain after eating yesterday. Pain has resolved on my evaluation. Vitals are normal. Abdomen is soft, benign. Labs are remarkable for mild hypokalemia (3.3). UA shows 80 ketones and 30 protein. She tolerated PO. Will d/c with return precautions.  Final Clinical Impressions(s) / ED Diagnoses   Final diagnoses:  Generalized abdominal pain    ED Discharge Orders    None       Recardo Evangelist, PA-C 08/02/18 0735    Mesner, Corene Cornea, MD 08/03/18 [id number]

## 2018-08-02 NOTE — ED Notes (Signed)
Gave pt crackers and ginger ale and will see if pt tolerates

## 2018-08-02 NOTE — ED Notes (Signed)
Husband up to nurses station, says pt has tolerated fluids and is ready to go home.

## 2018-08-02 NOTE — Discharge Instructions (Signed)
Please return if worsening.

## 2018-08-15 DIAGNOSIS — R8290 Unspecified abnormal findings in urine: Secondary | ICD-10-CM | POA: Diagnosis not present

## 2018-08-15 DIAGNOSIS — E876 Hypokalemia: Secondary | ICD-10-CM | POA: Diagnosis not present

## 2018-08-15 DIAGNOSIS — Z85048 Personal history of other malignant neoplasm of rectum, rectosigmoid junction, and anus: Secondary | ICD-10-CM | POA: Diagnosis not present

## 2018-08-15 DIAGNOSIS — Z933 Colostomy status: Secondary | ICD-10-CM | POA: Diagnosis not present

## 2018-08-29 ENCOUNTER — Ambulatory Visit (HOSPITAL_BASED_OUTPATIENT_CLINIC_OR_DEPARTMENT_OTHER): Payer: BLUE CROSS/BLUE SHIELD | Attending: Internal Medicine | Admitting: Cardiology

## 2018-08-29 DIAGNOSIS — R0683 Snoring: Secondary | ICD-10-CM | POA: Insufficient documentation

## 2018-08-29 DIAGNOSIS — F908 Attention-deficit hyperactivity disorder, other type: Secondary | ICD-10-CM

## 2018-08-29 DIAGNOSIS — R5383 Other fatigue: Secondary | ICD-10-CM | POA: Insufficient documentation

## 2018-08-31 DIAGNOSIS — D2272 Melanocytic nevi of left lower limb, including hip: Secondary | ICD-10-CM | POA: Diagnosis not present

## 2018-08-31 DIAGNOSIS — L821 Other seborrheic keratosis: Secondary | ICD-10-CM | POA: Diagnosis not present

## 2018-08-31 DIAGNOSIS — D2261 Melanocytic nevi of right upper limb, including shoulder: Secondary | ICD-10-CM | POA: Diagnosis not present

## 2018-08-31 DIAGNOSIS — D485 Neoplasm of uncertain behavior of skin: Secondary | ICD-10-CM | POA: Diagnosis not present

## 2018-08-31 DIAGNOSIS — D2262 Melanocytic nevi of left upper limb, including shoulder: Secondary | ICD-10-CM | POA: Diagnosis not present

## 2018-09-04 NOTE — Procedures (Signed)
   Patient Name: Colleen Kirby, Colleen Kirby Date: 08/29/2018 Gender: Female D.O.B: Mar 14, 1956 Age (years): 45 Referring Provider: Nelva Bush MD Height (inches): 40 Interpreting Physician: Fransico Him MD, ABSM Weight (lbs): 166 RPSGT: Jacolyn Reedy BMI: 27 MRN: [id number] Neck Size: 14.00  CLINICAL INFORMATION Sleep Study Type: HST  Indication for sleep study: Fatigue, Snoring  Epworth Sleepiness Score: 3  SLEEP STUDY TECHNIQUE A multi-channel overnight portable sleep study was performed. The channels recorded were: nasal airflow, thoracic respiratory movement, and oxygen saturation with a pulse oximetry. Snoring was also monitored.  MEDICATIONS Patient self administered medications include: N/A.  SLEEP ARCHITECTURE Patient was studied for 463.8 minutes. The sleep efficiency was 98.7 % and the patient was supine for 34.7%. The arousal index was 0.0 per hour.  RESPIRATORY PARAMETERS The overall AHI was 0.8 per hour, with a central apnea index of 0.0 per hour.  The oxygen nadir was 89% during sleep.  CARDIAC DATA Mean heart rate during sleep was 64.3 bpm.  IMPRESSIONS - No significant obstructive sleep apnea occurred during this study (AHI = 0.8/h). - No significant central sleep apnea occurred during this study (CAI = 0.0/h). - Mild oxygen desaturation was noted during this study (Min O2 = 89%). - Patient snored 3.1% during the sleep.  DIAGNOSIS - Normal study  RECOMMENDATIONS - Avoid alcohol, sedatives and other CNS depressants that may worsen sleep apnea and disrupt normal sleep architecture. - Sleep hygiene should be reviewed to assess factors that may improve sleep quality. - Weight management and regular exercise should be initiated or continued.  [Electronically signed] 09/04/2018 06:35 PM  Fransico Him MD, ABSM Diplomate, American Board of Sleep Medicine

## 2018-09-05 ENCOUNTER — Telehealth: Payer: Self-pay | Admitting: *Deleted

## 2018-09-05 ENCOUNTER — Other Ambulatory Visit: Payer: Self-pay | Admitting: Internal Medicine

## 2018-09-05 DIAGNOSIS — I471 Supraventricular tachycardia: Secondary | ICD-10-CM

## 2018-09-05 NOTE — Telephone Encounter (Signed)
Please review for refill. Thanks!  

## 2018-09-05 NOTE — Telephone Encounter (Signed)
-----   Message from Sueanne Margarita, MD sent at 09/04/2018  6:38 PM EST ----- Please let patient know that sleep study showed no significant sleep apnea.

## 2018-09-05 NOTE — Telephone Encounter (Signed)
Informed patient of sleep study results and patient understanding was verbalized. Patient understands her sleep study showed no significant sleep apnea.   Pt is aware and agreeable to normal results.  

## 2018-09-06 DIAGNOSIS — Z86010 Personal history of colonic polyps: Secondary | ICD-10-CM | POA: Diagnosis not present

## 2018-09-06 DIAGNOSIS — Z85048 Personal history of other malignant neoplasm of rectum, rectosigmoid junction, and anus: Secondary | ICD-10-CM | POA: Diagnosis not present

## 2018-09-06 DIAGNOSIS — Z9049 Acquired absence of other specified parts of digestive tract: Secondary | ICD-10-CM | POA: Diagnosis not present

## 2018-09-06 DIAGNOSIS — Z85038 Personal history of other malignant neoplasm of large intestine: Secondary | ICD-10-CM | POA: Diagnosis not present

## 2018-09-06 DIAGNOSIS — K435 Parastomal hernia without obstruction or  gangrene: Secondary | ICD-10-CM | POA: Diagnosis not present

## 2018-09-06 DIAGNOSIS — Z800 Family history of malignant neoplasm of digestive organs: Secondary | ICD-10-CM | POA: Diagnosis not present

## 2018-09-19 ENCOUNTER — Ambulatory Visit
Admission: RE | Admit: 2018-09-19 | Discharge: 2018-09-19 | Disposition: A | Discharge status: Home / Self Care | Payer: BLUE CROSS/BLUE SHIELD | Source: Ambulatory Visit | Attending: Obstetrics & Gynecology | Admitting: Obstetrics & Gynecology

## 2018-09-19 DIAGNOSIS — Z1231 Encounter for screening mammogram for malignant neoplasm of breast: Secondary | ICD-10-CM | POA: Diagnosis not present

## 2018-09-26 ENCOUNTER — Other Ambulatory Visit: Payer: Self-pay | Admitting: Obstetrics & Gynecology

## 2018-09-26 DIAGNOSIS — Z803 Family history of malignant neoplasm of breast: Secondary | ICD-10-CM

## 2018-10-08 ENCOUNTER — Ambulatory Visit
Admission: RE | Admit: 2018-10-08 | Discharge: 2018-10-08 | Disposition: A | Discharge status: Home / Self Care | Payer: BLUE CROSS/BLUE SHIELD | Source: Ambulatory Visit | Attending: Obstetrics & Gynecology | Admitting: Obstetrics & Gynecology

## 2018-10-08 DIAGNOSIS — N6012 Diffuse cystic mastopathy of left breast: Secondary | ICD-10-CM | POA: Diagnosis not present

## 2018-10-08 DIAGNOSIS — Z803 Family history of malignant neoplasm of breast: Secondary | ICD-10-CM

## 2018-10-08 DIAGNOSIS — N6011 Diffuse cystic mastopathy of right breast: Secondary | ICD-10-CM | POA: Diagnosis not present

## 2018-10-08 MED ORDER — GADOBUTROL 1 MMOL/ML IV SOLN
7.0000 mL | Freq: Once | INTRAVENOUS | Status: AC | PRN
  Administered 2018-10-08: 7 mL via INTRAVENOUS

## 2018-10-13 DIAGNOSIS — Z933 Colostomy status: Secondary | ICD-10-CM | POA: Diagnosis not present

## 2018-10-13 DIAGNOSIS — C20 Malignant neoplasm of rectum: Secondary | ICD-10-CM | POA: Diagnosis not present

## 2018-11-14 DIAGNOSIS — J069 Acute upper respiratory infection, unspecified: Secondary | ICD-10-CM | POA: Diagnosis not present

## 2018-11-14 DIAGNOSIS — W57XXXA Bitten or stung by nonvenomous insect and other nonvenomous arthropods, initial encounter: Secondary | ICD-10-CM | POA: Diagnosis not present

## 2019-03-24 DIAGNOSIS — C20 Malignant neoplasm of rectum: Secondary | ICD-10-CM | POA: Diagnosis not present

## 2019-03-24 DIAGNOSIS — Z933 Colostomy status: Secondary | ICD-10-CM | POA: Diagnosis not present

## 2019-04-13 ENCOUNTER — Telehealth: Payer: Self-pay | Admitting: Internal Medicine

## 2019-04-13 NOTE — Telephone Encounter (Signed)
  Patient was a patient of Dr End and she does not want to go to [facility] to see him. She needs to know who will be her doctor know since it is about time for her to be seen again.

## 2019-04-13 NOTE — Telephone Encounter (Signed)
I spoke to the patient who saw Dr End in July of 2019.  At that time, he suggested a 1 year f/u.  Since he is now in [place], she would like to see someone else on [facility]  I suggested Dr Radford Pax since she has had a sleep study.

## 2019-04-16 NOTE — Progress Notes (Signed)
Virtual Visit via Video Note   This visit type was conducted due to national recommendations for restrictions regarding the COVID-19 Pandemic (e.g. social distancing) in an effort to limit this patient's exposure and mitigate transmission in our community.  Due to her co-morbid illnesses, this patient is at least at moderate risk for complications without adequate follow up.  This format is felt to be most appropriate for this patient at this time.  All issues noted in this document were discussed and addressed.  A limited physical exam was performed with this format.  Please refer to the patient's chart for her consent to telehealth for [facility].   Evaluation Performed:  Follow-up visit  This visit type was conducted due to national recommendations for restrictions regarding the COVID-19 Pandemic (e.g. social distancing).  This format is felt to be most appropriate for this patient at this time.  All issues noted in this document were discussed and addressed.  No physical exam was performed (except for noted visual exam findings with Video Visits).  Please refer to the patient's chart (MyChart message for video visits and phone note for telephone visits) for the patient's consent to telehealth for [facility].  Date:  04/17/2019   ID:  Colleen Kirby, DOB 1956/02/07, MRN [id number]  Patient Location:  Home  Provider location:   [place]  PCP:  Leighton Ruff, MD  Cardiologist:  Fransico Him, MD Electrophysiologist:  None   Chief Complaint:  Palpitations and PSVT  History of Present Illness:    Colleen Kirby is a 63 y.o. female who presents via audio/video conferencing for a telehealth visit today.    Colleen Kirby is a 63 y.o. year-old female with history of PSVT who had been followed by Dr. Saunders Revel and is now reestablishing care with me as Dr. Saunders Revel moved to [place] office.  She has chronic LE edema felt secondary to chronic venous insufficiency.  2D echo in the past  was normal.  Her PSVT has been controlled on BB.  When she was last seen by Dr. Saunders Revel she complained of snoring, fatigue and PND and she underwent home sleep study in Jan 2020 showing no OSA (AHI 0.8/hr).  She is here today for followup and is doing well.  She says at night she will have some mild ankle edema which resolved by morning.  She does use some added salt in her diet.  She denies any chest pain or pressure, SOB, DOE, PND, orthopnea, LE edema, dizziness, palpitations or syncope. She is compliant with her meds and is tolerating meds with no SE.    The patient does not have symptoms concerning for COVID-19 infection (fever, chills, cough, or new shortness of breath).   Prior CV studies:   The following studies were reviewed today:  Home sleep study  Past Medical History:  Diagnosis Date  . Atypical chest pain   . 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 Family history of breast cancer   . Family history of colon cancer   . Family history of testicular cancer   . GERD (gastroesophageal reflux disease)   . History of DVT (deep vein thrombosis)   . Palpitations   . Rectal cancer ([place])    No past surgical history on file.   Current Meds  Medication Sig  . albuterol (PROVENTIL HFA;VENTOLIN HFA) 108 (90 BASE) MCG/ACT inhaler Inhale 2 puffs into the lungs every 6 (six) hours as needed. Wheezing  . aspirin EC 81 MG tablet  Take 81 mg by mouth daily.  . Calcium Citrate-Vitamin D (CITRACAL + D PO) Take 400 mg by mouth every other day. Vitamin C 600mg , Vitamin D 1000 units  . cholecalciferol (VITAMIN D) 1000 units tablet Take 2,000 Units by mouth daily.   . cycloSPORINE (RESTASIS) 0.05 % ophthalmic emulsion Place 1 drop into both eyes 2 (two) times daily.   . hydrOXYzine (ATARAX/VISTARIL) 10 MG tablet Take 10 mg by mouth at bedtime. Patient takes at bedtime  . ibuprofen (ADVIL,MOTRIN) 200 MG tablet Take 200-400 mg by mouth every 6 (six) hours as needed. Pain  . metoprolol  succinate (TOPROL-XL) 25 MG 24 hr tablet TAKE 1 TABLET BY MOUTH EVERY DAY  . Omega-3 Fatty Acids (OMEGA 3 PO) Take 1,250 mg by mouth daily.   . Probiotic Product (ALIGN PO) Take 1 tablet by mouth every other day.      Allergies:   Adhesive [tape]   Social History   Tobacco Use  . Smoking status: Never Smoker  . Smokeless tobacco: Never Used  Substance Use Topics  . Alcohol use: No  . Drug use: Not on file     Family Hx: The patient's family history includes Arrhythmia in an other family member; Breast cancer in her paternal grandmother; Breast cancer (age of onset: 22) in her mother; Colon cancer in her paternal aunt; Congestive Heart Failure in her mother; Coronary artery disease in an other family member; Healthy in her brother; Heart disease in her father, maternal grandmother, and paternal grandfather; Hypertension in her brother and mother; Pneumonia in her maternal uncle; Prostate cancer in her cousin; Testicular cancer (age of onset: 73) in her son.  ROS:   Please see the history of present illness.     All other systems reviewed and are negative.   Labs/Other Tests and Data Reviewed:    Recent Labs: 08/01/2018: ALT 21; BUN 15; Creatinine, Ser 1.01; Hemoglobin 13.3; Platelets 293; Potassium 3.3; Sodium 139   Recent Lipid Panel No results found for: CHOL, TRIG, HDL, CHOLHDL, LDLCALC, LDLDIRECT  Wt Readings from Last 3 Encounters:  04/17/19 163 lb (73.9 kg)  02/24/18 166 lb 9.6 oz (75.6 kg)  03/05/17 161 lb 9.6 oz (73.3 kg)     Objective:    Vital Signs:  BP 134/83   Ht 5\' 6"  (1.676 m)   Wt 163 lb (73.9 kg)   BMI 26.31 kg/m    CONSTITUTIONAL:  Well nourished, well developed female in no acute distress.  EYES: anicteric MOUTH: oral mucosa is pink RESPIRATORY: Normal respiratory effort, symmetric expansion CARDIOVASCULAR: No peripheral edema SKIN: No rash, lesions or ulcers MUSCULOSKELETAL: no digital cyanosis NEURO: Cranial Nerves II-XII grossly intact,  moves all extremities PSYCH: Intact judgement and insight.  A&O x 3, Mood/affect appropriate   ASSESSMENT & PLAN:    1.  PSVT - this seems to be well controlled on Toprol XL 12.5mg  daily.    2.  LE edema - likely related to dietary indiscretion with Na as well as chronic venous insuff.  I have asked her to cut out added salt in her diet. I will give her a Rx for HCTZ 12.5mg  daily PRN.    3.  Chronic venous insufficiency - she is concerned about the way her lower legs appear with a lot of spider veins and skin discoloration.  I will refer her to the vascular and Vein surgeons.   COVID-19 Education: The signs and symptoms of COVID-19 were discussed with the patient and how to seek  care for testing (follow up with PCP or arrange E-visit).  The importance of social distancing was discussed today.  Patient Risk:   After full review of this patient's clinical status, I feel that they are at least moderate risk at this time.  Time:   Today, I have spent 20 minutes directly with the patient on telemedicine discussing medical problems including PSVT.  We also reviewed the symptoms of COVID 19 and the ways to protect against contracting the virus with telehealth technology.   Medication Adjustments/Labs and Tests Ordered: Current medicines are reviewed at length with the patient today.  Concerns regarding medicines are outlined above.  Tests Ordered: No orders of the defined types were placed in this encounter.  Medication Changes: No orders of the defined types were placed in this encounter.   Disposition:  Follow up in 1 year(s)  Signed, Fransico Him, MD  04/17/2019 10:46 AM    [facility]

## 2019-04-17 ENCOUNTER — Telehealth (INDEPENDENT_AMBULATORY_CARE_PROVIDER_SITE_OTHER): Payer: BC Managed Care – PPO | Admitting: Cardiology

## 2019-04-17 ENCOUNTER — Other Ambulatory Visit: Payer: Self-pay

## 2019-04-17 ENCOUNTER — Encounter: Payer: Self-pay | Admitting: Cardiology

## 2019-04-17 VITALS — BP 134/83 | Ht 66.0 in | Wt 163.0 lb

## 2019-04-17 DIAGNOSIS — I83893 Varicose veins of bilateral lower extremities with other complications: Secondary | ICD-10-CM

## 2019-04-17 DIAGNOSIS — I872 Venous insufficiency (chronic) (peripheral): Secondary | ICD-10-CM

## 2019-04-17 DIAGNOSIS — R600 Localized edema: Secondary | ICD-10-CM

## 2019-04-17 DIAGNOSIS — I471 Supraventricular tachycardia: Secondary | ICD-10-CM | POA: Diagnosis not present

## 2019-04-17 MED ORDER — HYDROCHLOROTHIAZIDE 12.5 MG PO CAPS: 12.5000 mg | ORAL_CAPSULE | Freq: Every day | ORAL | 3 refills | Status: DC | PRN

## 2019-04-17 NOTE — Patient Instructions (Signed)
Medication Instructions:  Your physician has recommended you make the following change in your medication:   START: hydrochlorothiazide 12.5 mg tablet: Take 1 tablet by mouth daily AS NEEDED for leg swelling  Lab work: None Ordered  If you have labs (blood work) drawn today and your tests are completely normal, you will receive your results only by: Marland Kitchen MyChart Message (if you have MyChart) OR . A paper copy in the mail If you have any lab test that is abnormal or we need to change your treatment, we will call you to review the results.  Testing/Procedures: None ordered  Follow-Up: You have been referred to [facility] for Venous Insufficiency  At [facility], you and your health needs are our priority.  As part of our continuing mission to provide you with exceptional heart care, we have created designated Provider Care Teams.  These Care Teams include your primary Cardiologist (physician) and Advanced Practice Providers (APPs -  Physician Assistants and Nurse Practitioners) who all work together to provide you with the care you need, when you need it. . You will need a follow up appointment in 1 year.  Please call our office 2 months in advance to schedule this appointment.  You may see Fransico Him, MD or one of the following Advanced Practice Providers on your designated Care Team:   . Lyda Jester, PA-C . Dayna Dunn, PA-C . Ermalinda Barrios, PA-C  Any Other Special Instructions Will Be Listed Below (If Applicable).

## 2019-04-17 NOTE — Addendum Note (Signed)
Addended by: Drue Novel I on: 04/17/2019 11:50 AM   Modules accepted: Orders

## 2019-05-01 ENCOUNTER — Other Ambulatory Visit: Payer: Self-pay

## 2019-05-01 DIAGNOSIS — I471 Supraventricular tachycardia: Secondary | ICD-10-CM

## 2019-05-01 MED ORDER — METOPROLOL SUCCINATE ER 25 MG PO TB24: 25.0000 mg | ORAL_TABLET | Freq: Every day | ORAL | 1 refills | Status: DC

## 2019-05-12 ENCOUNTER — Encounter (HOSPITAL_COMMUNITY): Payer: BC Managed Care – PPO

## 2019-05-17 ENCOUNTER — Encounter (HOSPITAL_COMMUNITY): Payer: BC Managed Care – PPO

## 2019-05-17 ENCOUNTER — Encounter: Payer: BC Managed Care – PPO | Admitting: Family

## 2019-05-22 ENCOUNTER — Other Ambulatory Visit: Payer: Self-pay

## 2019-05-22 DIAGNOSIS — I872 Venous insufficiency (chronic) (peripheral): Secondary | ICD-10-CM

## 2019-05-29 ENCOUNTER — Other Ambulatory Visit: Payer: Self-pay

## 2019-05-29 ENCOUNTER — Encounter: Payer: Self-pay | Admitting: Family

## 2019-05-29 ENCOUNTER — Ambulatory Visit (HOSPITAL_COMMUNITY)
Admission: RE | Admit: 2019-05-29 | Discharge: 2019-05-29 | Disposition: A | Discharge status: Home / Self Care | Payer: BC Managed Care – PPO | Source: Ambulatory Visit | Attending: Family | Admitting: Family

## 2019-05-29 ENCOUNTER — Ambulatory Visit (INDEPENDENT_AMBULATORY_CARE_PROVIDER_SITE_OTHER): Payer: BC Managed Care – PPO | Admitting: Family

## 2019-05-29 VITALS — BP 124/80 | HR 76 | Temp 98.1°F | Resp 16 | Ht 66.0 in | Wt 171.0 lb

## 2019-05-29 DIAGNOSIS — I872 Venous insufficiency (chronic) (peripheral): Secondary | ICD-10-CM

## 2019-05-29 DIAGNOSIS — I8393 Asymptomatic varicose veins of bilateral lower extremities: Secondary | ICD-10-CM

## 2019-05-29 NOTE — Progress Notes (Signed)
Referred by:  Leighton Ruff, [place],  [place] [id number]  Reason for referral: varicose veins, occasional mild swelling in ankles at the end of the day  History of Present Illness  Colleen Kirby is a 62 y.o. (04-Jan-1956) female who presents with chief complaint:  Swelling in her ankles by the end of the day, not every day, and is not worsening. Varicose veins since her 20's. She denies pain in her legs, denies dyspnea.  She started getting spider veins in her ankles and behind her knees in her 20's. She states that both of her parents have spider veins.  She denies any history of ulcers or wounds in her lower extremities.   She denies fatigue or pain in the muscles of her legs when she walks.  She denies any history of  Lymphedema and no history of skin changes in lower legs.    The patient has used compression stockings in the past, she does not remember when or for what reason, other than when she was pregnant with her 3rd child. G3P3A0.  She developed a right calf DVT after she had been on oral estrogen replacement for menopause, she denies any history of pulmonary embolism. She took warfarin for a few weeks, states she does not think that she took it for as long as 6 months.   Sh has a history of GERD and colorectal cancer, she has a colostomy.   Her medications include a daily 81 mg ASA and a beta blocker, she states that she may have had PVC's. . She does not take a statin. She has never used tobacco.   Past Medical History:  Diagnosis Date  . Atypical chest pain   . 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 Family history of breast cancer   . Family history of colon cancer   . Family history of testicular cancer   . GERD (gastroesophageal reflux disease)   . History of DVT (deep vein thrombosis)   . Palpitations   . Rectal cancer [facility])     Past Surgical History:  Procedure Laterality Date  . SMALL INTESTINE SURGERY      Social  History   Socioeconomic History  . Marital status: Married    Spouse name: Not on file  . Number of children: 3  . Years of education: Not on file  . Highest education level: Not on file  Occupational History  . Not on file  Social Needs  . Financial resource strain: Not on file  . Food insecurity    Worry: Not on file    Inability: Not on file  . Transportation needs    Medical: Not on file    Non-medical: Not on file  Tobacco Use  . Smoking status: Never Smoker  . Smokeless tobacco: Never Used  Substance and Sexual Activity  . Alcohol use: No  . Drug use: Not on file  . Sexual activity: Not on file  Lifestyle  . Physical activity    Days per week: Not on file    Minutes per session: Not on file  . Stress: Not on file  Relationships  . Social Herbalist on phone: Not on file    Gets together: Not on file    Attends religious service: Not on file    Active member of club or organization: Not on file    Attends meetings of clubs or organizations: Not on file  Relationship status: Not on file  . Intimate partner violence    Fear of current or ex partner: Not on file    Emotionally abused: Not on file    Physically abused: Not on file    Forced sexual activity: Not on file  Other Topics Concern  . Not on file  Social History Narrative  . Not on file    Family History  Problem Relation Age of Onset  . Congestive Heart Failure Mother   . Hypertension Mother   . Breast cancer Mother 32  . Heart disease Father   . Hypertension Brother   . Healthy Brother   . Pneumonia Maternal Uncle        HIV related  . Colon cancer Paternal Aunt        died in her 75s  . Heart disease Maternal Grandmother   . Breast cancer Paternal Grandmother        dx in her 33s, died at 26  . Heart disease Paternal Grandfather   . Prostate cancer Cousin        paternal cousin with prostate cancer in his 57s  . Testicular cancer Son 68  . Coronary artery disease Other   .  Arrhythmia Other     Current Outpatient Medications on File Prior to Visit  Medication Sig Dispense Refill  . albuterol (PROVENTIL HFA;VENTOLIN HFA) 108 (90 BASE) MCG/ACT inhaler Inhale 2 puffs into the lungs every 6 (six) hours as needed. Wheezing    . aspirin EC 81 MG tablet Take 81 mg by mouth daily.    . Calcium Citrate-Vitamin D (CITRACAL + D PO) Take 400 mg by mouth every other day. Vitamin C 600mg , Vitamin D 1000 units    . cholecalciferol (VITAMIN D) 1000 units tablet Take 2,000 Units by mouth daily.     . cycloSPORINE (RESTASIS) 0.05 % ophthalmic emulsion Place 1 drop into both eyes 2 (two) times daily.     . hydrOXYzine (ATARAX/VISTARIL) 10 MG tablet Take 10 mg by mouth at bedtime. Patient takes at bedtime    . ibuprofen (ADVIL,MOTRIN) 200 MG tablet Take 200-400 mg by mouth every 6 (six) hours as needed. Pain    . metoprolol succinate (TOPROL-XL) 25 MG 24 hr tablet Take 1 tablet (25 mg total) by mouth daily. 90 tablet 1  . Omega-3 Fatty Acids (OMEGA 3 PO) Take 1,250 mg by mouth daily.     . Probiotic Product (ALIGN PO) Take 1 tablet by mouth every other day.     . hydrochlorothiazide (MICROZIDE) 12.5 MG capsule Take 1 capsule (12.5 mg total) by mouth daily as needed (edema). (Patient not taking: Reported on 05/29/2019) 30 capsule 3   No current facility-administered medications on file prior to visit.     Allergies  Allergen Reactions  . Adhesive [Tape] Itching and Rash    REVIEW OF SYSTEMS: Cardiovascular: No chest pain, chest pressure, orthopnea, or dyspnea on exertion. No claudication or rest pain,  No history of phlebitis. + history of right calf DVT, few weeks of warfarin. + history of PVC's, takes metoprolol.  Pulmonary: No productive cough, asthma or wheezing. Neurologic: No weakness, paresthesias, aphasia, or amaurosis. No dizziness. Hematologic: No bleeding problems or clotting disorders. Musculoskeletal: No joint pain or joint swelling. She reports occasional  thoracic area back ache.  Gastrointestinal: No blood in stool or hematemesis. She has a colostomy, has a history of colorectal cancer. Has GERD.  Genitourinary: No dysuria or hematuria. Psychiatric:: No history of major depression.  Integumentary: No rashes or ulcers. Constitutional: No fever or chills.  Physical Examination Vitals:   05/29/19 0909  BP: 124/80  Pulse: 76  Resp: 16  Temp: 98.1 F (36.7 C)  TempSrc: Temporal  SpO2: 98%  Weight: 171 lb (77.6 kg)  Height: 5\' 6"  (1.676 m)   Body mass index is 27.6 kg/m.  PHYSICAL EXAMINATION: General: The patient appears her stated age.   HEENT:  No gross abnormalities Pulmonary: Respirations are non-labored, CTAB, good air movement in all fields, no rales, rhonchi, or wheezes.  Abdomen: Soft and non-tender. Colostomy appliance in place lateral and distal to umbilicus, hernia at the perimeter of colostomy reduced by shapewear.  Musculoskeletal: There are no major deformities.   Neurologic: No focal weakness or paresthesias are detected, muscle strength in all extremities is  5/5.  Skin: There are no ulcer or rashes noted. Psychiatric: The patient has normal affect. Cardiovascular: There is a regular rate and rhythm without significant murmur appreciated.  Multiple spider veins noted throughout both legs, few small varicosities.   Vascular: Vessel Right Left  Radial 2+Palpable 2+Palpable  Carotid Palpable, without bruit Palpable, without bruit  Aorta Not palpable N/A  Femoral 2+Palpable 2+Palpable  Popliteal 1+ palpable faintly palpable  PT 1+Palpable 1+Palpable  DP 1+Palpable 1+Palpable    Non-Invasive Vascular Imaging  BLE Venous Duplex (Date: 05/29/2019):  Venous Reflux Times Normal value < 0.5 sec +------------------------------+----------+---------+                               Right (ms)Left (ms) +------------------------------+----------+---------+ CFV                                     1452.00    +------------------------------+----------+---------+ Popliteal                     1210.00             +------------------------------+----------+---------+ GSV at Saphenofemoral junction          1452.00   +------------------------------+----------+---------+ GSV prox thigh                3660.00   1269.00   +------------------------------+----------+---------+ GSV mid thigh                 4628.00   5200.00   +------------------------------+----------+---------+ GSV dist thigh                4738.00   5244.00   +------------------------------+----------+---------+ GSV at knee                             5252.00   +------------------------------+----------+---------+ GSV prox calf                           3836.00   +------------------------------+----------+---------+ SSV origin                    4614.00   2538.00   +------------------------------+----------+---------+  +------------------------------+----------+---------+ VEIN DIAMETERS:               Right (cm)Left (cm) +------------------------------+----------+---------+ GSV at Saphenofemoral junction0.848     0.906     +------------------------------+----------+---------+ GSV at prox thigh             0.403     0.38      +------------------------------+----------+---------+  GSV at mid thigh              0.405     0.407     +------------------------------+----------+---------+ GSV at distal thigh           0.44      0.694     +------------------------------+----------+---------+ GSV at knee                   0.207     0.42      +------------------------------+----------+---------+ GSV prox calf                           0.334     +------------------------------+----------+---------+ SSV origin                    0.228     0.169     +------------------------------+----------+---------+ SSV prox                      0.175     0.174      +------------------------------+----------+---------+ SSV mid                       0.26      0.169     +------------------------------+----------+---------+   Summary: Right: Abnormal reflux times were noted in the popliteal vein, great saphenous vein at the proximal thigh, and origin of the small saphenous vein. Study performed at 8am. Reflux may be present later in the day that was not seen on this exam. There is no evidence of deep vein thrombosis in the lower extremity.There is no evidence of superficial venous thrombosis.  Left: Abnormal reflux times were noted in the common femoral vein, great saphenous vein at the saphenofemoral junction, great saphenous vein at the proximal thigh, great saphenous vein at the mid thigh, great saphenous vein at the distal thigh, great  saphenous vein at the knee, great saphenous vein at the proximal calf, and origin of the small saphenous vein. Study performed at 8am. Reflux may be present later in the day that was not on this exam. There is no evidence of deep vein thrombosis in the lower extremity.There is no evidence of superficial venous thrombosis.    Medical Decision Making  Colleen Kirby is a 63 y.o. female who presents with: extensive venous reflux in both legs noted on venous duplex today. She has minimal occasional dependent edema in her ankles. She has multiple spider veins and a few small to moderate sized varicosities in her legs. She has no aching or fatigue in her legs.  She has used thigh high and knee high compression hose in the past for short periods of time.  She has a history of DVT in her right calf shortly after staring oral estrogen for menopausal symptoms, took warfarin for a few weeks for that.  No DVT noted on bilateral lower extremity venous duplex today.    Based on the patient's history and examination, I recommend: thigh high compression hose and elevation of lower extremities, see Patient Instructions.  I  discussed with the patient the use of her 20-30 mm thigh high compression stockings and need for 3 month trial of such.  The patient will follow up in 3 months with Dr. Scot Dock or Dr. Oneida Alar in the [facility] for evaluation for: how conservative treatment of venous reflux has worked for patient, and discussion of possible interventions to address.  Thank you for  allowing [place] to participate in this patient's care.  Clemon Chambers, RN, MSN, FNP-C [facility] Office: [phone]  05/29/2019, 9:16 AM  Clinic MD: Carlis Abbott on call

## 2019-05-29 NOTE — Patient Instructions (Addendum)
To decrease swelling in your feet and legs: Elevate feet above slightly bent knees, feet above heart, overnight and 3-4 times per day for 20 minutes.   Varicose Veins Varicose veins are veins that have become enlarged, bulged, and twisted. They most often appear in the legs. What are the causes? This condition is caused by damage to the valves in the vein. These valves help blood return to your heart. When they are damaged and they stop working properly, blood may flow backward and back up in the veins near the skin, causing the veins to get larger and appear twisted. The condition can result from any issue that causes blood to back up, like pregnancy, prolonged standing, or obesity. What increases the risk? This condition is more likely to develop in people who are:  On their feet a lot.  Pregnant.  Overweight. What are the signs or symptoms? Symptoms of this condition include:  Bulging, twisted, and bluish veins.  A feeling of heaviness. This may be worse at the end of the day.  Leg pain. This may be worse at the end of the day.  Swelling in the leg.  Changes in skin color over the veins. How is this diagnosed? This condition may be diagnosed based on your symptoms, a physical exam, and an ultrasound test. How is this treated? Treatment for this condition may involve:  Avoiding sitting or standing in one position for long periods of time.  Wearing compression stockings. These stockings help to prevent blood clots and reduce swelling in the legs.  Raising (elevating) the legs when resting.  Losing weight.  Exercising regularly. If you have persistent symptoms or want to improve the way your varicose veins look, you may choose to have a procedure to close the varicose veins off or to remove them. Treatments to close off the veins include:  Sclerotherapy. In this treatment, a solution is injected into a vein to close it off.  Laser treatment. In this treatment, the  vein is heated with a laser to close it off.  Radiofrequency vein ablation. In this treatment, an electrical current produced by radio waves is used to close off the vein. Treatments to remove the veins include:  Phlebectomy. In this treatment, the veins are removed through small incisions made over the veins.  Vein ligation and stripping. In this treatment, incisions are made over the veins. The veins are then removed after being tied (ligated) with stitches (sutures). Follow these instructions at home: Activity  Walk as much as possible. Walking increases blood flow. This helps blood return to the heart and takes pressure off your veins. It also increases your cardiovascular strength.  Follow your health care provider's instructions about exercising.  Do not stand or sit in one position for a long period of time.  Do not sit with your legs crossed.  Rest with your legs raised during the day. General instructions   Follow any diet instructions given to you by your health care provider.  Wear compression stockings as directed by your health care provider. Do not wear other kinds of tight clothing around your legs, pelvis, or waist.  Elevate your legs at night to above the level of your heart.  If you get a cut in the skin over the varicose vein and the vein bleeds: ? Lie down with your leg raised. ? Apply firm pressure to the cut with a clean cloth until the bleeding stops. ? Place a bandage (dressing) on the cut. Contact a health  care provider if:  The skin around your varicose veins starts to break down.  You have pain, redness, tenderness, or hard swelling over a vein.  You are uncomfortable because of pain.  You get a cut in the skin over a varicose vein and it will not stop bleeding. Summary  Varicose veins are veins that have become enlarged, bulged, and twisted. They most often appear in the legs.  This condition is caused by damage to the valves in the vein.  These valves help blood return to your heart.  Treatment for this condition includes frequent movements, wearing compression stockings, losing weight, and exercising regularly. In some cases, procedures are done to close off or remove the veins.  Treatment for this condition may include wearing compression stockings, elevating the legs, losing weight, and engaging in regular activity. In some cases, procedures are done to close off or remove the veins. This information is not intended to replace advice given to you by your health care provider. Make sure you discuss any questions you have with your health care provider. Document Released: 05/13/2005 Document Revised: 09/29/2018 Document Reviewed: 08/26/2016 Elsevier Patient Education  2020 [employer].       Chronic Venous Insufficiency Chronic venous insufficiency is a condition where the leg veins cannot effectively pump blood from the legs to the heart. This happens when the vein walls are either stretched, weakened, or damaged, or when the valves inside the vein are damaged. With the right treatment, you should be able to continue with an active life. This condition is also called venous stasis. What are the causes? Common causes of this condition include:  High blood pressure inside the veins (venous hypertension).  Sitting or standing too long, causing increased blood pressure in the leg veins.  A blood clot that blocks blood flow in a vein (deep vein thrombosis, DVT).  Inflammation of a vein (phlebitis) that causes a blood clot to form.  Tumors in the pelvis that cause blood to back up. What increases the risk? The following factors may make you more likely to develop this condition:  Having a family history of this condition.  Obesity.  Pregnancy.  Living without enough regular physical activity or exercise (sedentary lifestyle).  Smoking.  Having a job that requires long periods of standing or sitting in one  place.  Being a certain age. Women in their 9s and 49s and men in their 43s are more likely to develop this condition. What are the signs or symptoms? Symptoms of this condition include:  Veins that are enlarged, bulging, or twisted (varicose veins).  Skin breakdown or ulcers.  Reddened skin or dark discoloration of skin on the leg between the knee and ankle.  Brown, smooth, tight, and painful skin just above the ankle, usually on the inside of the leg (lipodermatosclerosis).  Swelling of the legs. How is this diagnosed? This condition may be diagnosed based on:  Your medical history.  A physical exam.  Tests, such as: ? A procedure that creates an image of a blood vessel and nearby organs and provides information about blood flow through the blood vessel (duplex ultrasound). ? A procedure that tests blood flow (plethysmography). ? A procedure that looks at the veins using X-ray and dye (venogram). How is this treated? The goals of treatment are to help you return to an active life and to minimize pain or disability. Treatment depends on the severity of your condition, and it may include:  Wearing compression stockings. These can  help relieve symptoms and help prevent your condition from getting worse. However, they do not cure the condition.  Sclerotherapy. This procedure involves an injection of a solution that shrinks damaged veins.  Surgery. This may involve: ? Removing a diseased vein (vein stripping). ? Cutting off blood flow through the vein (laser ablation surgery). ? Repairing or reconstructing a valve within the affected vein. Follow these instructions at home:      Wear compression stockings as told by your health care provider. These stockings help to prevent blood clots and reduce swelling in your legs.  Take over-the-counter and prescription medicines only as told by your health care provider.  Stay active by exercising, walking, or doing different  activities. Ask your health care provider what activities are safe for you and how much exercise you need.  Drink enough fluid to keep your urine pale yellow.  Do not use any products that contain nicotine or tobacco, such as cigarettes, e-cigarettes, and chewing tobacco. If you need help quitting, ask your health care provider.  Keep all follow-up visits as told by your health care provider. This is important. Contact a health care provider if you:  Have redness, swelling, or more pain in the affected area.  See a red streak or line that goes up or down from the affected area.  Have skin breakdown or skin loss in the affected area, even if the breakdown is small.  Get an injury in the affected area. Get help right away if:  You get an injury and an open wound in the affected area.  You have: ? Severe pain that does not get better with medicine. ? Sudden numbness or weakness in the foot or ankle below the affected area. ? Trouble moving your foot or ankle. ? A fever. ? Worse or persistent symptoms. ? Chest pain. ? Shortness of breath. Summary  Chronic venous insufficiency is a condition where the leg veins cannot effectively pump blood from the legs to the heart.  Chronic venous insufficiency occurs when the vein walls become stretched, weakened, or damaged, or when valves within the vein are damaged.  Treatment depends on how severe your condition is. It often involves wearing compression stockings and may involve having a procedure.  Make sure you stay active by exercising, walking, or doing different activities. Ask your health care provider what activities are safe for you and how much exercise you need. This information is not intended to replace advice given to you by your health care provider. Make sure you discuss any questions you have with your health care provider. Document Released: 12/07/2006 Document Revised: 04/26/2018 Document Reviewed: 04/26/2018 Elsevier  Patient Education  2020 [employer].

## 2019-05-30 ENCOUNTER — Encounter: Payer: Self-pay | Admitting: Surgery

## 2019-06-05 DIAGNOSIS — Z6827 Body mass index (BMI) 27.0-27.9, adult: Secondary | ICD-10-CM | POA: Diagnosis not present

## 2019-06-05 DIAGNOSIS — Z01419 Encounter for gynecological examination (general) (routine) without abnormal findings: Secondary | ICD-10-CM | POA: Diagnosis not present

## 2019-06-16 DIAGNOSIS — Z23 Encounter for immunization: Secondary | ICD-10-CM | POA: Diagnosis not present

## 2019-08-08 ENCOUNTER — Other Ambulatory Visit: Payer: Self-pay | Admitting: Cardiology

## 2019-09-08 IMAGING — MR MR BILATERAL BREAST WITHOUT AND WITH CONTRAST
8 of 12 series · 33 of 48 positions shown · IV contrast (7ml gadavist)
Comparison: Previous exam(s).

CLINICAL DATA: 62-year-old female for screening breast MRI due to
high lifetime risk of developing breast cancer (25.5%).

LABS:  Creatinine was obtained on site at [HOSPITAL] at [REDACTED] [HOSPITAL].
Results: Creatinine 0.8 mg/dL.  INM-AE.
EXAM:
BILATERAL BREAST MRI WITH AND WITHOUT CONTRAST
TECHNIQUE: Multiplanar, multisequence MR images of both breasts were obtained
prior to and following the intravenous administration of 7 ml of
Gadavist

[Series 3: t2_tirm_tra ipat · axial · 3.0mm · 0.70mm/px · 1 of 67 slices shown]
[im 1/67]
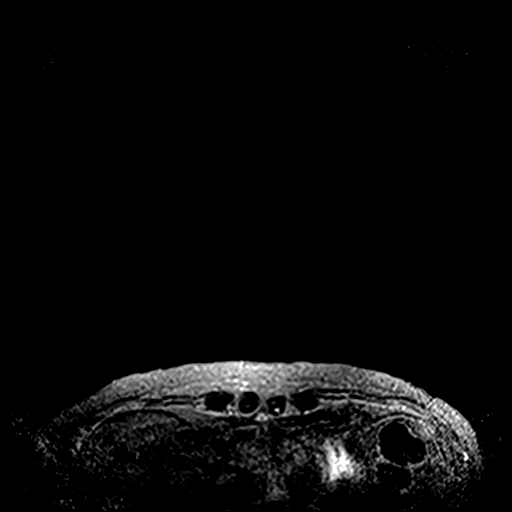

[Series 4: fl3d pre-cm no · axial · non-contrast · 1.2mm · 0.94mm/px · z∈[-85,+125]mm · 5 of 176 slices shown]
[im 1/176]
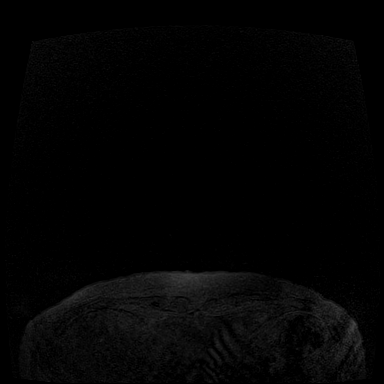
[im 44/176]
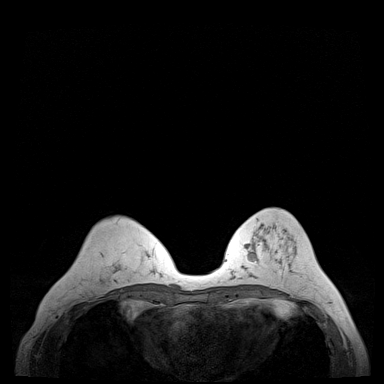
[im 88/176]
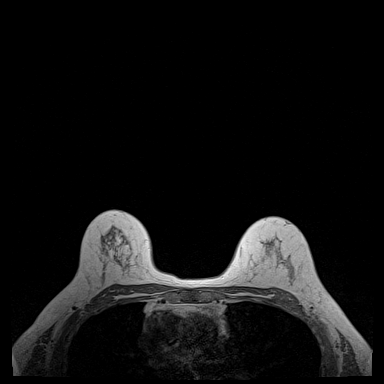
[im 132/176]
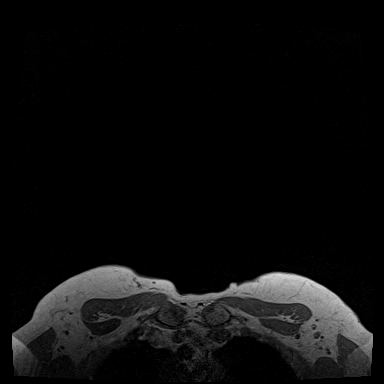
[im 176/176]
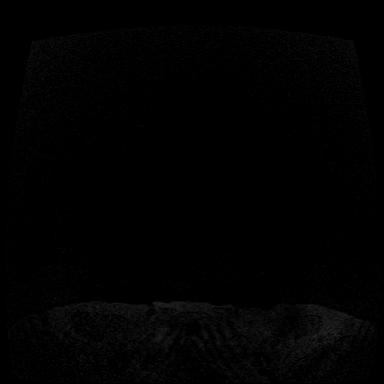

[Series 5: fl3d pre-cm · axial · non-contrast · 1.2mm · 0.94mm/px · z∈[-85,+125]mm · 5 of 176 slices shown]
[im 1/176]
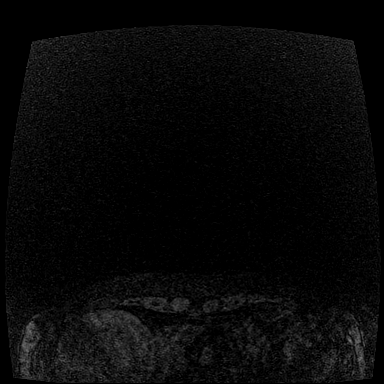
[im 44/176]
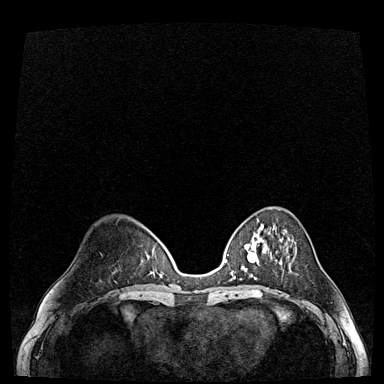
[im 88/176]
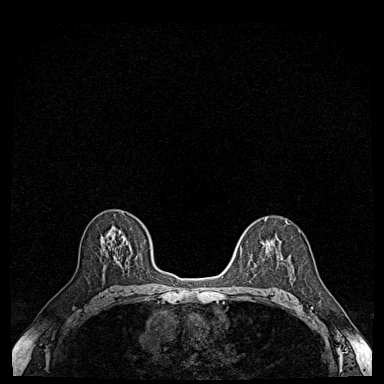
[im 132/176]
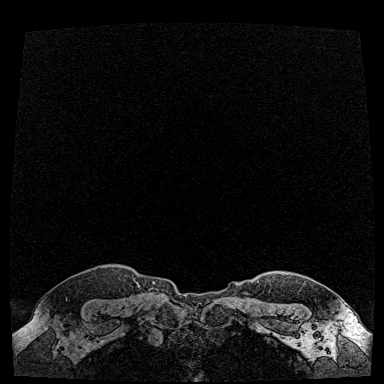
[im 176/176]
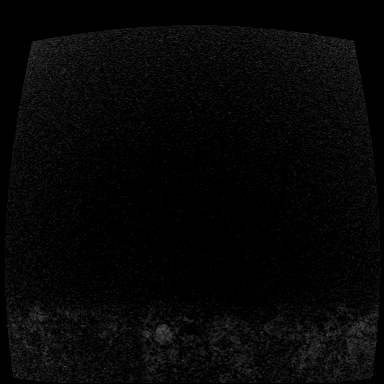

[Series 6: fl3d post-cm 20 · axial · 1.2mm · 0.94mm/px · z∈[-85,+125]mm · 5 of 176 slices shown (1 of 3)]
[im 1/176]
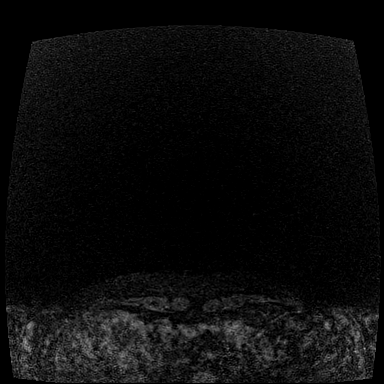
[im 44/176]
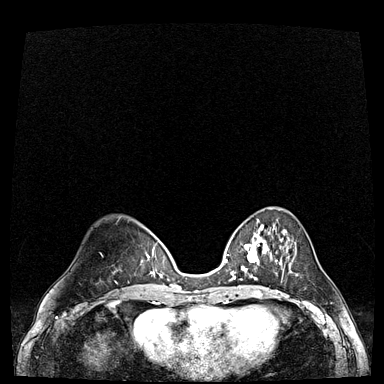
[im 88/176]
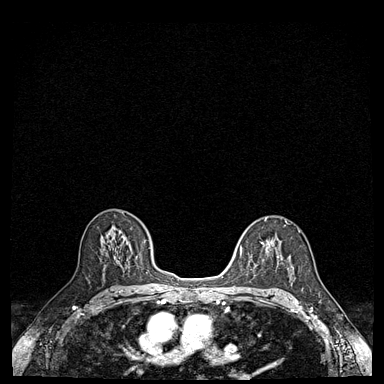
[im 132/176]
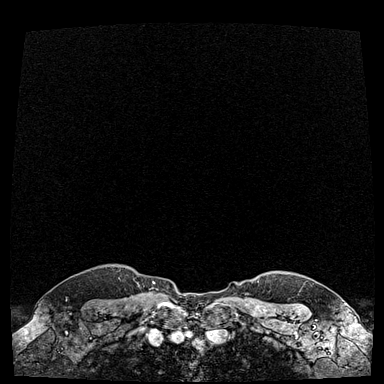
[im 176/176]
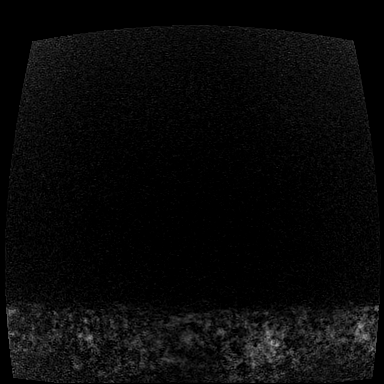

[Series 7: fl3d post-cm 20 · axial · 1.2mm · 0.94mm/px · z∈[-85,+125]mm · 5 of 176 slices shown (2 of 3)]
[im 1/176]
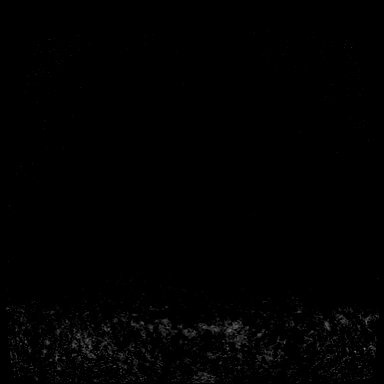
[im 44/176]
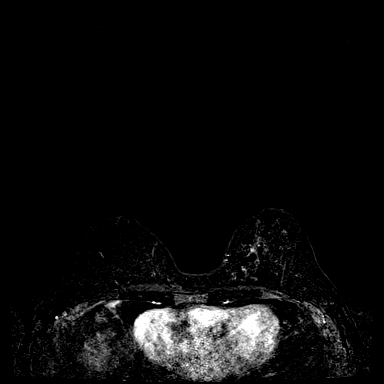
[im 88/176]
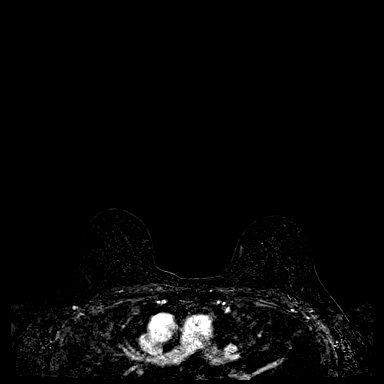
[im 132/176]
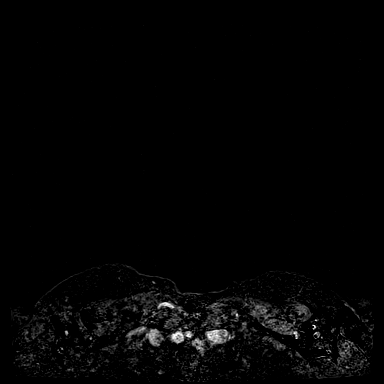
[im 176/176]
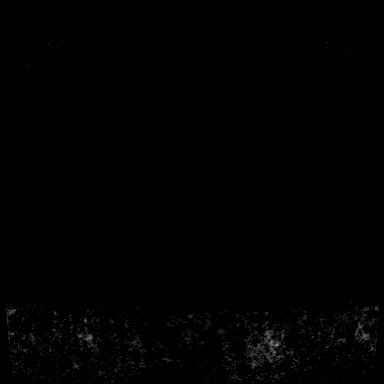

[Series 8: fl3d post-cm 20 · axial · 211.2mm · 0.94mm/px · 1 of 1 slices shown (3 of 3)]
[im 1/1]
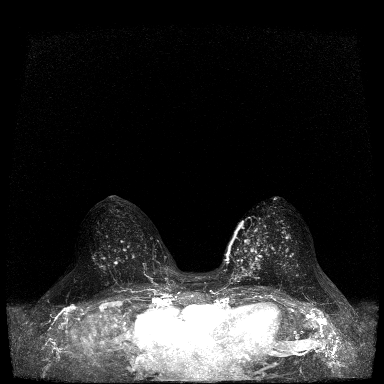

[Series 9: fl3d post-cm 3min · axial · 1.2mm · 0.94mm/px · z∈[-85,+125]mm · 6 of 176 slices shown]
[im 1/176]
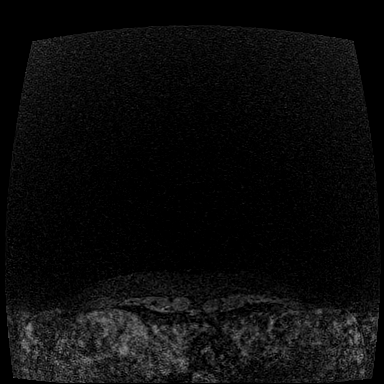
[im 36/176]
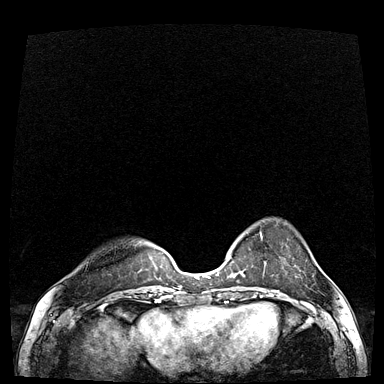
[im 71/176]
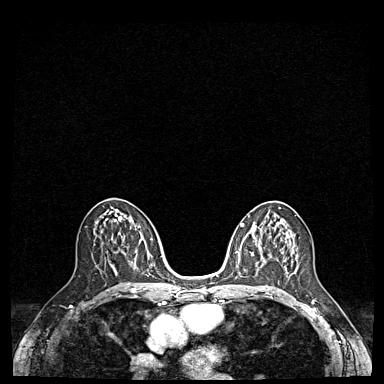
[im 106/176]
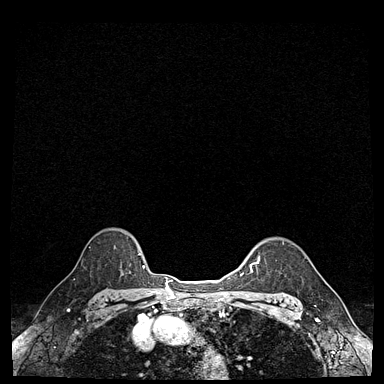
[im 141/176]
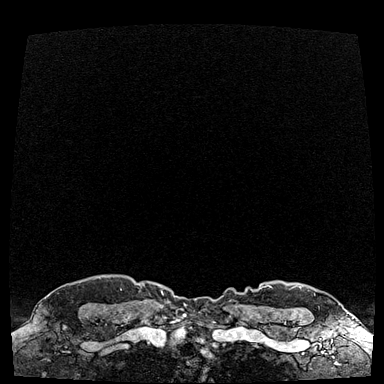
[im 176/176]
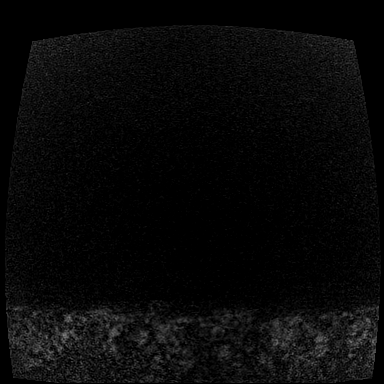

[Series 10: fl3d post-cm 3min_sub · axial · 1.2mm · 0.94mm/px · z∈[-85,+83]mm · 5 of 176 slices shown]
[im 1/176]
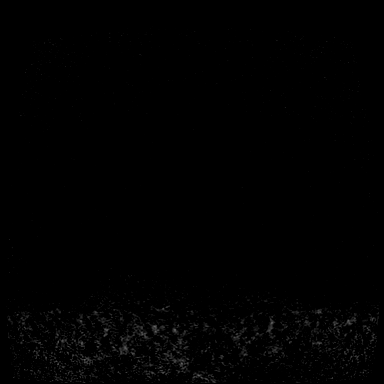
[im 36/176]
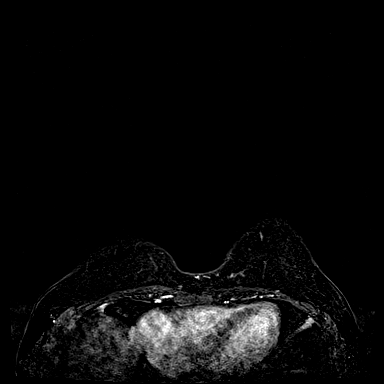
[im 71/176]
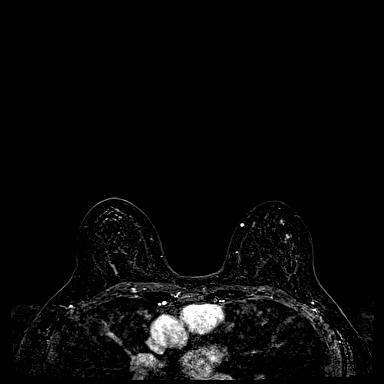
[im 106/176]
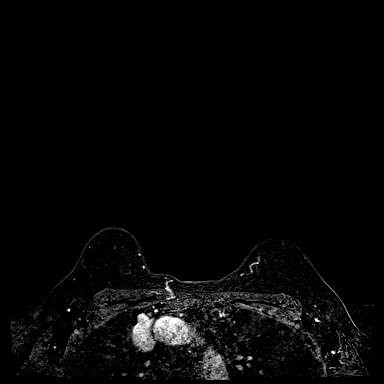
[im 141/176]
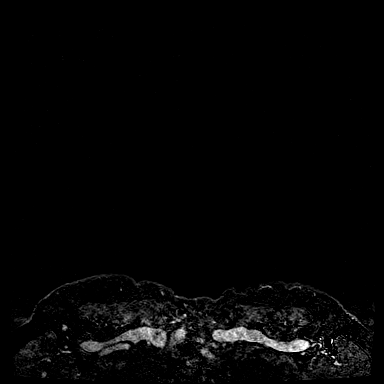

[33 of 48 positions shown; findings below may reference images not displayed]

Three-dimensional MR images were rendered by post-processing of the
original MR data on an independent workstation. The
three-dimensional MR images were interpreted, and findings are
reported in the following complete MRI report for this study. Three
dimensional images were evaluated at the independent [employer]
workstation
FINDINGS: Breast composition: c. Heterogeneous fibroglandular tissue.

Background parenchymal enhancement: Mild

Right breast: Cysts within the RIGHT breast are noted. No suspicious
mass or enhancement.

Left breast: Cysts within the LEFT breast are noted. No suspicious
mass or enhancement.

Lymph nodes: No abnormal appearing lymph nodes.

Ancillary findings:  None.
IMPRESSION: 1. Benign cystic changes within both breasts. No MR evidence of
breast malignancy.

RECOMMENDATION:
Bilateral screening MR in 1 year in this high risk patient.

Bilateral screening mammogram in 1 year.

BI-RADS CATEGORY  2: Benign.

## 2019-09-21 ENCOUNTER — Ambulatory Visit: Payer: BC Managed Care – PPO | Admitting: Vascular Surgery

## 2019-09-28 DIAGNOSIS — D2262 Melanocytic nevi of left upper limb, including shoulder: Secondary | ICD-10-CM | POA: Diagnosis not present

## 2019-09-28 DIAGNOSIS — L918 Other hypertrophic disorders of the skin: Secondary | ICD-10-CM | POA: Diagnosis not present

## 2019-09-28 DIAGNOSIS — D225 Melanocytic nevi of trunk: Secondary | ICD-10-CM | POA: Diagnosis not present

## 2019-09-28 DIAGNOSIS — D2261 Melanocytic nevi of right upper limb, including shoulder: Secondary | ICD-10-CM | POA: Diagnosis not present

## 2019-09-28 DIAGNOSIS — L821 Other seborrheic keratosis: Secondary | ICD-10-CM | POA: Diagnosis not present

## 2019-10-15 DIAGNOSIS — Z23 Encounter for immunization: Secondary | ICD-10-CM | POA: Diagnosis not present

## 2019-10-23 ENCOUNTER — Other Ambulatory Visit: Payer: Self-pay | Admitting: Obstetrics & Gynecology

## 2019-10-23 DIAGNOSIS — Z1231 Encounter for screening mammogram for malignant neoplasm of breast: Secondary | ICD-10-CM

## 2019-10-25 DIAGNOSIS — Z933 Colostomy status: Secondary | ICD-10-CM | POA: Diagnosis not present

## 2019-10-25 DIAGNOSIS — C20 Malignant neoplasm of rectum: Secondary | ICD-10-CM | POA: Diagnosis not present

## 2019-10-26 DIAGNOSIS — Z933 Colostomy status: Secondary | ICD-10-CM | POA: Diagnosis not present

## 2019-11-07 DIAGNOSIS — Z23 Encounter for immunization: Secondary | ICD-10-CM | POA: Diagnosis not present

## 2019-11-16 ENCOUNTER — Ambulatory Visit: Payer: BC Managed Care – PPO | Admitting: Vascular Surgery

## 2019-11-23 ENCOUNTER — Ambulatory Visit: Payer: BC Managed Care – PPO | Admitting: Vascular Surgery

## 2019-12-11 DIAGNOSIS — N3946 Mixed incontinence: Secondary | ICD-10-CM | POA: Diagnosis not present

## 2019-12-11 DIAGNOSIS — N3040 Irradiation cystitis without hematuria: Secondary | ICD-10-CM | POA: Diagnosis not present

## 2019-12-11 DIAGNOSIS — R102 Pelvic and perineal pain: Secondary | ICD-10-CM | POA: Diagnosis not present

## 2019-12-13 ENCOUNTER — Telehealth (HOSPITAL_COMMUNITY): Payer: Self-pay

## 2019-12-13 NOTE — Telephone Encounter (Signed)
The above patient or their representative was contacted and gave the following answers to these questions:         Do you have any of the following symptoms?    NO  Fever                    Cough                   Shortness of breath  Do  you have any of the following other symptoms?    muscle pain         vomiting,        diarrhea        rash         weakness        red eye        abdominal pain         bruising          bruising or bleeding              joint pain           severe headache    Have you been in contact with someone who was or has been sick in the past 2 weeks?  NO  Yes                 Unsure                         Unable to assess   Does the person that you were in contact with have any of the following symptoms?   Cough         shortness of breath           muscle pain         vomiting,            diarrhea            rash            weakness           fever            red eye           abdominal pain           bruising  or  bleeding                joint pain                severe headache                 COMMENTS OR ACTION PLAN FOR THIS PATIENT:        ALL QUESTION WERE ANSWERED/[facility] 

## 2019-12-14 ENCOUNTER — Other Ambulatory Visit: Payer: Self-pay

## 2019-12-14 ENCOUNTER — Encounter: Payer: Self-pay | Admitting: Vascular Surgery

## 2019-12-14 ENCOUNTER — Ambulatory Visit (INDEPENDENT_AMBULATORY_CARE_PROVIDER_SITE_OTHER): Payer: BC Managed Care – PPO | Admitting: Vascular Surgery

## 2019-12-14 VITALS — BP 122/75 | HR 80 | Temp 97.3°F | Resp 16 | Ht 66.0 in | Wt 167.0 lb

## 2019-12-14 DIAGNOSIS — I872 Venous insufficiency (chronic) (peripheral): Secondary | ICD-10-CM | POA: Diagnosis not present

## 2019-12-14 NOTE — Progress Notes (Signed)
Patient name: Colleen Kirby MRN: [id number] DOB: 1956/07/27 Sex: female  REASON FOR VISIT:   55-month follow-up visit  HPI:   Colleen Kirby is a pleasant 64 y.o. female who was seen by our nurse practitioner on 05/29/2019 with varicose veins and ankle swelling.  At that time she was complaining of swelling in her ankles at the end of the day.  She had varicose veins since she was in her 55s.  She has a previous history of a right calf DVT.  On exam she had spider veins bilaterally and some small to moderate sized varicosities.  Importance of intermittent leg elevation was discussed with the patient.  She was placed in thigh-high compression stockings with a gradient of 20 to 30 mmHg and returns for a follow-up visit.  On my history the patient does complain of ankle swelling which she has had for many years.  She denies significant pain in her legs.  She has had some spider veins in both legs which are gradually gotten worse.  She has had no previous venous procedures.  She has remote history of a right calf DVT.  She has been wearing her thigh-high compression stockings but injured her back trying to get them on and has not been wearing them recently.  These helped her symptoms some.  She does elevate her legs some which helps with the swelling.  Past Medical History:  Diagnosis Date  . Atypical chest pain   . 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 Family history of breast cancer   . Family history of colon cancer   . Family history of testicular cancer   . GERD (gastroesophageal reflux disease)   . History of DVT (deep vein thrombosis)   . Palpitations   . Rectal cancer ([place])     Family History  Problem Relation Age of Onset  . Congestive Heart Failure Mother   . Hypertension Mother   . Breast cancer Mother 20  . Heart disease Father   . Hypertension Brother   . Healthy Brother   . Pneumonia Maternal Uncle        HIV related  . Colon cancer Paternal Aunt          died in her 48s  . Heart disease Maternal Grandmother   . Breast cancer Paternal Grandmother        dx in her 70s, died at 42  . Heart disease Paternal Grandfather   . Prostate cancer Cousin        paternal cousin with prostate cancer in his 55s  . Testicular cancer Son 78  . Coronary artery disease Other   . Arrhythmia Other     SOCIAL HISTORY: Social History   Tobacco Use  . Smoking status: Never Smoker  . Smokeless tobacco: Never Used  Substance Use Topics  . Alcohol use: No    Allergies  Allergen Reactions  . Adhesive [Tape] Itching and Rash    Current Outpatient Medications  Medication Sig Dispense Refill  . albuterol (PROVENTIL HFA;VENTOLIN HFA) 108 (90 BASE) MCG/ACT inhaler Inhale 2 puffs into the lungs every 6 (six) hours as needed. Wheezing    . aspirin EC 81 MG tablet Take 81 mg by mouth daily.    . cholecalciferol (VITAMIN D) 1000 units tablet Take 2,000 Units by mouth daily.     . cycloSPORINE (RESTASIS) 0.05 % ophthalmic emulsion Place 1 drop into both eyes 2 (two) times daily.     Marland Kitchen  hydrOXYzine (ATARAX/VISTARIL) 10 MG tablet Take 5 mg by mouth at bedtime. Patient takes at bedtime     . ibuprofen (ADVIL,MOTRIN) 200 MG tablet Take 200-400 mg by mouth every 6 (six) hours as needed. Pain    . metoprolol succinate (TOPROL-XL) 25 MG 24 hr tablet Take 1 tablet (25 mg total) by mouth daily. (Patient taking differently: Take 12.5 mg by mouth daily. ) 90 tablet 1  . Omega-3 Fatty Acids (OMEGA 3 PO) Take 1,250 mg by mouth daily.     . Probiotic Product (ALIGN PO) Take 1 tablet by mouth every other day.     . Calcium Citrate-Vitamin D (CITRACAL + D PO) Take 400 mg by mouth every other day. Vitamin C 600mg , Vitamin D 1000 units    . hydrochlorothiazide (MICROZIDE) 12.5 MG capsule TAKE 1 CAPSULE (12.5 MG TOTAL) BY MOUTH DAILY AS NEEDED (EDEMA). (Patient not taking: Reported on 12/14/2019) 90 capsule 2   No current facility-administered medications for this visit.     REVIEW OF SYSTEMS:  [X]  denotes positive finding, [ ]  denotes negative finding Cardiac  Comments:  Chest pain or chest pressure:    Shortness of breath upon exertion:    Short of breath when lying flat:    Irregular heart rhythm:        Vascular    Pain in calf, thigh, or hip brought on by ambulation:    Pain in feet at night that wakes you up from your sleep:     Blood clot in your veins:    Leg swelling:  x       Pulmonary    Oxygen at home:    Productive cough:     Wheezing:         Neurologic    Sudden weakness in arms or legs:     Sudden numbness in arms or legs:     Sudden onset of difficulty speaking or slurred speech:    Temporary loss of vision in one eye:     Problems with dizziness:         Gastrointestinal    Blood in stool:     Vomited blood:         Genitourinary    Burning when urinating:     Blood in urine:        Psychiatric    Major depression:         Hematologic    Bleeding problems:    Problems with blood clotting too easily:        Skin    Rashes or ulcers:        Constitutional    Fever or chills:     PHYSICAL EXAM:   Vitals:   12/14/19 1611  BP: 122/75  Pulse: 80  Resp: 16  Temp: (!) 97.3 F (36.3 C)  TempSrc: Temporal  SpO2: 99%  Weight: 167 lb (75.8 kg)  Height: 5\' 6"  (1.676 m)    GENERAL: The patient is a well-nourished female, in no acute distress. The vital signs are documented above. CARDIAC: There is a regular rate and rhythm.  VASCULAR: I do not detect carotid bruits. She has palpable dorsalis pedis and posterior tibial pulses bilaterally. She has some spider veins bilaterally also reticular veins bilaterally.  She has some enlarged varicose veins in her medial right calf and right thigh.       I did look at her left great saphenous vein myself with the SonoSite and the vein does have reflux  throughout the thigh but narrows down to about 0.38 in the proximal thigh.  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 PULMONARY: There is good air exchange bilaterally without wheezing or rales. ABDOMEN: Soft and non-tender with normal pitched bowel sounds.  MUSCULOSKELETAL: There are no major deformities or cyanosis. NEUROLOGIC: No focal weakness or paresthesias are detected. SKIN: There are no ulcers or rashes noted. PSYCHIATRIC: The patient has a normal affect.  DATA:    VENOUS DUPLEX: I have reviewed her venous duplex scan from 05/29/2020.  On the right side there was no evidence of DVT.  There was deep venous reflux involving the popliteal vein.  There was superficial venous reflux in the right great saphenous vein in the proximal mid and distal thigh.  The vein had diameters ranging from 0.4-0.44 cm.  On the left side, there was no evidence of DVT.  There was deep venous reflux involving the common femoral vein.  There was reflux in the left great saphenous vein from the saphenofemoral junction of the proximal calf.  The focal area of dilation in the distal thigh measured 0.69 cm previously.  The vein in the thigh had diameters ranging from 0.38-0.41 cm.  MEDICAL ISSUES:   CHRONIC VENOUS DISEASE: This patient has CEAP C2 venous disease with some varicose veins in her medial right thigh and calf.  She also has significant telangiectasias and reticular veins bilaterally.  She has some deep venous and superficial venous reflux bilaterally.  We have again discussed the importance of intermittent leg elevation and the proper positioning for this.  We are having her fitted for some knee-high compression stockings with a gradient of 15 to 20 mmHg.  I encouraged her to avoid prolonged sitting and standing.  We discussed the importance of exercise specifically walking and water aerobics.  If her symptoms progress in the future then certainly she could be reevaluated and may potentially be a candidate for laser ablation of the left great saphenous vein.  We have also discussed the option of  sclerotherapy for her reticular veins and telangiectasias.  Deitra Mayo [facility] [phone]

## 2019-12-15 DIAGNOSIS — M7989 Other specified soft tissue disorders: Secondary | ICD-10-CM

## 2019-12-19 DIAGNOSIS — H2513 Age-related nuclear cataract, bilateral: Secondary | ICD-10-CM | POA: Diagnosis not present

## 2019-12-19 DIAGNOSIS — H52203 Unspecified astigmatism, bilateral: Secondary | ICD-10-CM | POA: Diagnosis not present

## 2019-12-19 DIAGNOSIS — H04123 Dry eye syndrome of bilateral lacrimal glands: Secondary | ICD-10-CM | POA: Diagnosis not present

## 2019-12-20 DIAGNOSIS — E559 Vitamin D deficiency, unspecified: Secondary | ICD-10-CM | POA: Diagnosis not present

## 2019-12-20 DIAGNOSIS — E7800 Pure hypercholesterolemia, unspecified: Secondary | ICD-10-CM | POA: Diagnosis not present

## 2019-12-25 ENCOUNTER — Ambulatory Visit
Admission: RE | Admit: 2019-12-25 | Discharge: 2019-12-25 | Disposition: A | Discharge status: Home / Self Care | Payer: BC Managed Care – PPO | Source: Ambulatory Visit | Attending: Obstetrics & Gynecology | Admitting: Obstetrics & Gynecology

## 2019-12-25 ENCOUNTER — Other Ambulatory Visit: Payer: Self-pay

## 2019-12-25 DIAGNOSIS — Z1231 Encounter for screening mammogram for malignant neoplasm of breast: Secondary | ICD-10-CM

## 2019-12-26 DIAGNOSIS — Z0000 Encounter for general adult medical examination without abnormal findings: Secondary | ICD-10-CM | POA: Diagnosis not present

## 2019-12-26 DIAGNOSIS — E559 Vitamin D deficiency, unspecified: Secondary | ICD-10-CM | POA: Diagnosis not present

## 2019-12-26 DIAGNOSIS — I493 Ventricular premature depolarization: Secondary | ICD-10-CM | POA: Diagnosis not present

## 2019-12-26 DIAGNOSIS — Z85048 Personal history of other malignant neoplasm of rectum, rectosigmoid junction, and anus: Secondary | ICD-10-CM | POA: Diagnosis not present

## 2019-12-26 DIAGNOSIS — E7800 Pure hypercholesterolemia, unspecified: Secondary | ICD-10-CM | POA: Diagnosis not present

## 2020-01-26 DIAGNOSIS — M545 Low back pain: Secondary | ICD-10-CM | POA: Diagnosis not present

## 2020-02-01 DIAGNOSIS — M545 Low back pain: Secondary | ICD-10-CM | POA: Diagnosis not present

## 2020-02-07 DIAGNOSIS — M545 Low back pain: Secondary | ICD-10-CM | POA: Diagnosis not present

## 2020-02-14 DIAGNOSIS — M545 Low back pain: Secondary | ICD-10-CM | POA: Diagnosis not present

## 2020-02-21 DIAGNOSIS — M545 Low back pain: Secondary | ICD-10-CM | POA: Diagnosis not present

## 2020-02-21 DIAGNOSIS — Z23 Encounter for immunization: Secondary | ICD-10-CM | POA: Diagnosis not present

## 2020-04-11 DIAGNOSIS — C20 Malignant neoplasm of rectum: Secondary | ICD-10-CM | POA: Diagnosis not present

## 2020-04-11 DIAGNOSIS — Z933 Colostomy status: Secondary | ICD-10-CM | POA: Diagnosis not present

## 2020-05-21 DIAGNOSIS — Z23 Encounter for immunization: Secondary | ICD-10-CM | POA: Diagnosis not present

## 2020-06-11 DIAGNOSIS — Z6827 Body mass index (BMI) 27.0-27.9, adult: Secondary | ICD-10-CM | POA: Diagnosis not present

## 2020-06-11 DIAGNOSIS — Z01419 Encounter for gynecological examination (general) (routine) without abnormal findings: Secondary | ICD-10-CM | POA: Diagnosis not present

## 2020-08-01 DIAGNOSIS — Z8659 Personal history of other mental and behavioral disorders: Secondary | ICD-10-CM | POA: Diagnosis not present

## 2020-08-23 ENCOUNTER — Encounter: Payer: Self-pay | Admitting: Cardiology

## 2020-08-23 ENCOUNTER — Other Ambulatory Visit: Payer: Self-pay

## 2020-08-23 ENCOUNTER — Ambulatory Visit: Payer: BC Managed Care – PPO | Admitting: Cardiology

## 2020-08-23 VITALS — BP 120/68 | HR 73 | Ht 66.0 in | Wt 168.0 lb

## 2020-08-23 DIAGNOSIS — I471 Supraventricular tachycardia: Secondary | ICD-10-CM

## 2020-08-23 DIAGNOSIS — R600 Localized edema: Secondary | ICD-10-CM | POA: Diagnosis not present

## 2020-08-23 DIAGNOSIS — I872 Venous insufficiency (chronic) (peripheral): Secondary | ICD-10-CM | POA: Diagnosis not present

## 2020-08-23 MED ORDER — METOPROLOL SUCCINATE ER 25 MG PO TB24: 12.5000 mg | ORAL_TABLET | Freq: Every day | ORAL | 3 refills | Status: DC

## 2020-08-23 NOTE — Progress Notes (Signed)
Date:  08/23/2020   ID:  Colleen Kirby, DOB October 08, 1955, MRN [id number]   PCP:  Leighton Ruff, MD  Cardiologist:  Fransico Him, MD Electrophysiologist:  None   Chief Complaint:  Palpitations and PSVT  History of Present Illness:    Colleen Kirby is a 65 y.o. female with history of PSVT  And chronic LE edema felt secondary to chronic venous insufficiency.  2D echo in the past was normal.  Her PSVT has been controlled on BB.  When she was last seen by Dr. Saunders Revel she complained of snoring, fatigue and PND and she underwent home sleep study in Jan 2020 showing no OSA (AHI 0.8/hr).  She is here today for followup and is doing well.  She denies any chest pain or pressure, SOB, DOE, PND, orthopnea, LE edema, dizziness or syncope.  Occasionally she will notice a skipped heart beats but no sustained tachycardia. She is compliant with her meds and is tolerating meds with no SE.    Prior CV studies:   The following studies were reviewed today:  EKG was performed in the office today and showed NSR with no ST changes  Past Medical History:  Diagnosis Date  . Atypical chest pain   . 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 Family history of breast cancer   . Family history of colon cancer   . Family history of testicular cancer   . GERD (gastroesophageal reflux disease)   . History of DVT (deep vein thrombosis)   . Palpitations   . Rectal cancer [facility])    Past Surgical History:  Procedure Laterality Date  . SMALL INTESTINE SURGERY       Current Meds  Medication Sig  . acetaminophen (TYLENOL) 500 MG tablet Take 500 mg by mouth every 6 (six) hours as needed for mild pain or headache.  . albuterol (PROVENTIL HFA;VENTOLIN HFA) 108 (90 BASE) MCG/ACT inhaler Inhale 2 puffs into the lungs every 6 (six) hours as needed. Wheezing  . aspirin EC 81 MG tablet Take 81 mg by mouth daily.  . Calcium Citrate-Vitamin D (CITRACAL + D PO) Take 400 mg by mouth every other day. Vitamin C  600mg , Vitamin D 1000 units  . cholecalciferol (VITAMIN D) 1000 units tablet Take 2,000 Units by mouth daily.   Marland Kitchen docusate sodium (STOOL SOFTENER) 100 MG capsule Take 100 mg by mouth daily as needed for mild constipation.  . hydrOXYzine (ATARAX/VISTARIL) 10 MG tablet Take 5 mg by mouth at bedtime. Patient takes at bedtime  . ibuprofen (ADVIL,MOTRIN) 200 MG tablet Take 200-400 mg by mouth every 6 (six) hours as needed. Pain  . metoprolol succinate (TOPROL-XL) 25 MG 24 hr tablet Take 12.5 mg by mouth daily.  . Omega-3 Fatty Acids (OMEGA 3 PO) Take 1,250 mg by mouth daily.   . Probiotic Product (ALIGN PO) Take 1 tablet by mouth every other day.      Allergies:   Adhesive [tape]   Social History   Tobacco Use  . Smoking status: Never Smoker  . Smokeless tobacco: Never Used  Substance Use Topics  . Alcohol use: No     Family Hx: The patient's family history includes Arrhythmia in an other family member; Breast cancer in her paternal grandmother; Breast cancer (age of onset: 2) in her mother; Colon cancer in her paternal aunt; Congestive Heart Failure in her mother; Coronary artery disease in an other family member; Healthy in her brother; Heart disease in her father, maternal  grandmother, and paternal grandfather; Hypertension in her brother and mother; Pneumonia in her maternal uncle; Prostate cancer in her cousin; Testicular cancer (age of onset: 56) in her son.  ROS:   Please see the history of present illness.     All other systems reviewed and are negative.   Labs/Other Tests and Data Reviewed:    Recent Labs: No results found for requested labs within last 8760 hours.   Recent Lipid Panel No results found for: CHOL, TRIG, HDL, CHOLHDL, LDLCALC, LDLDIRECT  Wt Readings from Last 3 Encounters:  08/23/20 168 lb (76.2 kg)  12/14/19 167 lb (75.8 kg)  05/29/19 171 lb (77.6 kg)     Objective:    Vital Signs:  BP 120/68   Pulse 73   Ht 5\' 6"  (1.676 m)   Wt 168 lb (76.2 kg)    BMI 27.12 kg/m    GEN: Well nourished, well developed in no acute distress HEENT: Normal NECK: No JVD; No carotid bruits LYMPHATICS: No lymphadenopathy CARDIAC:RRR, no murmurs, rubs, gallops RESPIRATORY:  Clear to auscultation without rales, wheezing or rhonchi  ABDOMEN: Soft, non-tender, non-distended MUSCULOSKELETAL:  No edema; No deformity  SKIN: Warm and dry NEUROLOGIC:  Alert and oriented x 3 PSYCHIATRIC:  Normal affect    ASSESSMENT & PLAN:    1.  PSVT -well suppressed on BB and has not had any sustained palpitations but occasionally notices some skipped beats -continue Toprol XL 12.5mg  daily  2.  LE edema  - likely related to dietary indiscretion with Na as well as chronic venous insuff.  -encouraged her to cut out added Na in her diet  -she has not had to use the HCTZ PRN  3.  Chronic venous insufficiency  -followed by Dr. Scot Dock and using knee-high compression hose   Medication Adjustments/Labs and Tests Ordered: Current medicines are reviewed at length with the patient today.  Concerns regarding medicines are outlined above.  Tests Ordered: Orders Placed This Encounter  Procedures  . EKG 12-Lead   Medication Changes: No orders of the defined types were placed in this encounter.   Disposition:  Follow up in 1 year(s)  Signed, Fransico Him, MD  08/23/2020 11:30 AM    [place]

## 2020-08-23 NOTE — Patient Instructions (Signed)

## 2020-08-23 NOTE — Addendum Note (Signed)
Addended by: Antonieta Iba on: 08/23/2020 11:38 AM   Modules accepted: Orders

## 2020-09-27 DIAGNOSIS — D2261 Melanocytic nevi of right upper limb, including shoulder: Secondary | ICD-10-CM | POA: Diagnosis not present

## 2020-09-27 DIAGNOSIS — L821 Other seborrheic keratosis: Secondary | ICD-10-CM | POA: Diagnosis not present

## 2020-09-27 DIAGNOSIS — D2271 Melanocytic nevi of right lower limb, including hip: Secondary | ICD-10-CM | POA: Diagnosis not present

## 2020-09-27 DIAGNOSIS — D2262 Melanocytic nevi of left upper limb, including shoulder: Secondary | ICD-10-CM | POA: Diagnosis not present

## 2020-09-27 DIAGNOSIS — L820 Inflamed seborrheic keratosis: Secondary | ICD-10-CM | POA: Diagnosis not present

## 2020-10-14 DIAGNOSIS — Z933 Colostomy status: Secondary | ICD-10-CM | POA: Diagnosis not present

## 2020-10-14 DIAGNOSIS — C20 Malignant neoplasm of rectum: Secondary | ICD-10-CM | POA: Diagnosis not present

## 2020-12-24 DIAGNOSIS — K219 Gastro-esophageal reflux disease without esophagitis: Secondary | ICD-10-CM | POA: Diagnosis not present

## 2020-12-24 DIAGNOSIS — E7800 Pure hypercholesterolemia, unspecified: Secondary | ICD-10-CM | POA: Diagnosis not present

## 2020-12-24 DIAGNOSIS — Z85048 Personal history of other malignant neoplasm of rectum, rectosigmoid junction, and anus: Secondary | ICD-10-CM | POA: Diagnosis not present

## 2020-12-24 DIAGNOSIS — E559 Vitamin D deficiency, unspecified: Secondary | ICD-10-CM | POA: Diagnosis not present

## 2020-12-27 DIAGNOSIS — Z0000 Encounter for general adult medical examination without abnormal findings: Secondary | ICD-10-CM | POA: Diagnosis not present

## 2020-12-27 DIAGNOSIS — Z23 Encounter for immunization: Secondary | ICD-10-CM | POA: Diagnosis not present

## 2021-01-02 DIAGNOSIS — Z23 Encounter for immunization: Secondary | ICD-10-CM | POA: Diagnosis not present

## 2021-01-03 ENCOUNTER — Other Ambulatory Visit: Payer: Self-pay | Admitting: Obstetrics & Gynecology

## 2021-01-03 DIAGNOSIS — Z1231 Encounter for screening mammogram for malignant neoplasm of breast: Secondary | ICD-10-CM

## 2021-01-08 ENCOUNTER — Other Ambulatory Visit: Payer: Self-pay

## 2021-01-08 ENCOUNTER — Ambulatory Visit
Admission: RE | Admit: 2021-01-08 | Discharge: 2021-01-08 | Disposition: A | Discharge status: Home / Self Care | Payer: BC Managed Care – PPO | Source: Ambulatory Visit | Attending: Obstetrics & Gynecology | Admitting: Obstetrics & Gynecology

## 2021-01-08 DIAGNOSIS — Z1231 Encounter for screening mammogram for malignant neoplasm of breast: Secondary | ICD-10-CM

## 2021-01-10 ENCOUNTER — Other Ambulatory Visit: Payer: Self-pay | Admitting: Family Medicine

## 2021-01-10 ENCOUNTER — Ambulatory Visit
Admission: RE | Admit: 2021-01-10 | Discharge: 2021-01-10 | Disposition: A | Discharge status: Home / Self Care | Payer: BC Managed Care – PPO | Source: Ambulatory Visit | Attending: Family Medicine | Admitting: Family Medicine

## 2021-01-10 DIAGNOSIS — R221 Localized swelling, mass and lump, neck: Secondary | ICD-10-CM

## 2021-01-27 DIAGNOSIS — R509 Fever, unspecified: Secondary | ICD-10-CM | POA: Diagnosis not present

## 2021-01-27 DIAGNOSIS — Z20822 Contact with and (suspected) exposure to covid-19: Secondary | ICD-10-CM | POA: Diagnosis not present

## 2021-01-27 DIAGNOSIS — B349 Viral infection, unspecified: Secondary | ICD-10-CM | POA: Diagnosis not present

## 2021-01-27 DIAGNOSIS — R051 Acute cough: Secondary | ICD-10-CM | POA: Diagnosis not present

## 2021-02-01 DIAGNOSIS — R399 Unspecified symptoms and signs involving the genitourinary system: Secondary | ICD-10-CM | POA: Diagnosis not present

## 2021-02-01 DIAGNOSIS — N3001 Acute cystitis with hematuria: Secondary | ICD-10-CM | POA: Diagnosis not present

## 2021-02-13 DIAGNOSIS — R059 Cough, unspecified: Secondary | ICD-10-CM | POA: Diagnosis not present

## 2021-02-13 DIAGNOSIS — R0789 Other chest pain: Secondary | ICD-10-CM | POA: Diagnosis not present

## 2021-02-13 DIAGNOSIS — K219 Gastro-esophageal reflux disease without esophagitis: Secondary | ICD-10-CM | POA: Diagnosis not present

## 2021-02-14 ENCOUNTER — Ambulatory Visit
Admission: RE | Admit: 2021-02-14 | Discharge: 2021-02-14 | Disposition: A | Discharge status: Home / Self Care | Payer: BC Managed Care – PPO | Source: Ambulatory Visit | Attending: Family Medicine | Admitting: Family Medicine

## 2021-02-14 ENCOUNTER — Other Ambulatory Visit: Payer: Self-pay

## 2021-02-14 ENCOUNTER — Other Ambulatory Visit: Payer: Self-pay | Admitting: Family Medicine

## 2021-02-14 DIAGNOSIS — R0789 Other chest pain: Secondary | ICD-10-CM

## 2021-02-14 DIAGNOSIS — I700 Atherosclerosis of aorta: Secondary | ICD-10-CM | POA: Diagnosis not present

## 2021-02-14 DIAGNOSIS — R059 Cough, unspecified: Secondary | ICD-10-CM | POA: Diagnosis not present

## 2021-02-14 DIAGNOSIS — K219 Gastro-esophageal reflux disease without esophagitis: Secondary | ICD-10-CM | POA: Diagnosis not present

## 2021-02-25 DIAGNOSIS — R102 Pelvic and perineal pain: Secondary | ICD-10-CM | POA: Diagnosis not present

## 2021-02-27 DIAGNOSIS — Z85048 Personal history of other malignant neoplasm of rectum, rectosigmoid junction, and anus: Secondary | ICD-10-CM | POA: Diagnosis not present

## 2021-02-27 DIAGNOSIS — K219 Gastro-esophageal reflux disease without esophagitis: Secondary | ICD-10-CM | POA: Diagnosis not present

## 2021-03-10 DIAGNOSIS — Z87440 Personal history of urinary (tract) infections: Secondary | ICD-10-CM | POA: Diagnosis not present

## 2021-03-10 DIAGNOSIS — N3946 Mixed incontinence: Secondary | ICD-10-CM | POA: Diagnosis not present

## 2021-03-10 DIAGNOSIS — N3040 Irradiation cystitis without hematuria: Secondary | ICD-10-CM | POA: Diagnosis not present

## 2021-04-04 DIAGNOSIS — K219 Gastro-esophageal reflux disease without esophagitis: Secondary | ICD-10-CM | POA: Diagnosis not present

## 2021-04-04 DIAGNOSIS — Z933 Colostomy status: Secondary | ICD-10-CM | POA: Diagnosis not present

## 2021-04-04 DIAGNOSIS — Z538 Procedure and treatment not carried out for other reasons: Secondary | ICD-10-CM | POA: Diagnosis not present

## 2021-04-04 DIAGNOSIS — Z85048 Personal history of other malignant neoplasm of rectum, rectosigmoid junction, and anus: Secondary | ICD-10-CM | POA: Diagnosis not present

## 2021-04-16 DIAGNOSIS — K219 Gastro-esophageal reflux disease without esophagitis: Secondary | ICD-10-CM | POA: Diagnosis not present

## 2021-04-16 DIAGNOSIS — Z85048 Personal history of other malignant neoplasm of rectum, rectosigmoid junction, and anus: Secondary | ICD-10-CM | POA: Diagnosis not present

## 2021-05-05 DIAGNOSIS — Z933 Colostomy status: Secondary | ICD-10-CM | POA: Diagnosis not present

## 2021-05-05 DIAGNOSIS — C20 Malignant neoplasm of rectum: Secondary | ICD-10-CM | POA: Diagnosis not present

## 2021-06-14 DIAGNOSIS — Z23 Encounter for immunization: Secondary | ICD-10-CM | POA: Diagnosis not present

## 2021-06-23 DIAGNOSIS — E559 Vitamin D deficiency, unspecified: Secondary | ICD-10-CM | POA: Diagnosis not present

## 2021-06-23 DIAGNOSIS — B349 Viral infection, unspecified: Secondary | ICD-10-CM | POA: Diagnosis not present

## 2021-06-23 DIAGNOSIS — R059 Cough, unspecified: Secondary | ICD-10-CM | POA: Diagnosis not present

## 2021-06-23 DIAGNOSIS — E7800 Pure hypercholesterolemia, unspecified: Secondary | ICD-10-CM | POA: Diagnosis not present

## 2021-06-24 DIAGNOSIS — Z13820 Encounter for screening for osteoporosis: Secondary | ICD-10-CM | POA: Diagnosis not present

## 2021-06-24 DIAGNOSIS — Z6826 Body mass index (BMI) 26.0-26.9, adult: Secondary | ICD-10-CM | POA: Diagnosis not present

## 2021-06-24 DIAGNOSIS — Z01419 Encounter for gynecological examination (general) (routine) without abnormal findings: Secondary | ICD-10-CM | POA: Diagnosis not present

## 2021-06-25 ENCOUNTER — Other Ambulatory Visit: Payer: Self-pay | Admitting: Obstetrics & Gynecology

## 2021-06-25 DIAGNOSIS — Z1239 Encounter for other screening for malignant neoplasm of breast: Secondary | ICD-10-CM

## 2021-06-26 DIAGNOSIS — K219 Gastro-esophageal reflux disease without esophagitis: Secondary | ICD-10-CM | POA: Diagnosis not present

## 2021-06-26 DIAGNOSIS — E7800 Pure hypercholesterolemia, unspecified: Secondary | ICD-10-CM | POA: Diagnosis not present

## 2021-06-26 DIAGNOSIS — Z933 Colostomy status: Secondary | ICD-10-CM | POA: Diagnosis not present

## 2021-06-26 DIAGNOSIS — E559 Vitamin D deficiency, unspecified: Secondary | ICD-10-CM | POA: Diagnosis not present

## 2021-07-16 ENCOUNTER — Ambulatory Visit
Admission: RE | Admit: 2021-07-16 | Discharge: 2021-07-16 | Disposition: A | Discharge status: Home / Self Care | Payer: BC Managed Care – PPO | Source: Ambulatory Visit | Attending: Obstetrics & Gynecology | Admitting: Obstetrics & Gynecology

## 2021-07-16 DIAGNOSIS — Z803 Family history of malignant neoplasm of breast: Secondary | ICD-10-CM | POA: Diagnosis not present

## 2021-07-16 DIAGNOSIS — Z1239 Encounter for other screening for malignant neoplasm of breast: Secondary | ICD-10-CM

## 2021-07-16 MED ORDER — GADOBUTROL 1 MMOL/ML IV SOLN
7.0000 mL | Freq: Once | INTRAVENOUS | Status: AC | PRN
  Administered 2021-07-16: 7 mL via INTRAVENOUS

## 2021-08-21 ENCOUNTER — Other Ambulatory Visit: Payer: Self-pay | Admitting: Cardiology

## 2021-09-05 ENCOUNTER — Encounter: Payer: Self-pay | Admitting: Cardiology

## 2021-09-05 ENCOUNTER — Other Ambulatory Visit: Payer: Self-pay

## 2021-09-05 ENCOUNTER — Ambulatory Visit: Payer: BC Managed Care – PPO | Admitting: Cardiology

## 2021-09-05 VITALS — BP 116/72 | HR 72 | Ht 66.0 in | Wt 158.2 lb

## 2021-09-05 DIAGNOSIS — I700 Atherosclerosis of aorta: Secondary | ICD-10-CM | POA: Insufficient documentation

## 2021-09-05 DIAGNOSIS — I872 Venous insufficiency (chronic) (peripheral): Secondary | ICD-10-CM | POA: Diagnosis not present

## 2021-09-05 DIAGNOSIS — I471 Supraventricular tachycardia: Secondary | ICD-10-CM

## 2021-09-05 DIAGNOSIS — R600 Localized edema: Secondary | ICD-10-CM

## 2021-09-05 MED ORDER — METOPROLOL SUCCINATE ER 25 MG PO TB24: 12.5000 mg | ORAL_TABLET | Freq: Every day | ORAL | 3 refills | Status: DC

## 2021-09-05 NOTE — Addendum Note (Signed)
Addended by: Antonieta Iba on: 09/05/2021 09:08 AM   Modules accepted: Orders

## 2021-09-05 NOTE — Addendum Note (Signed)
Addended by: Sueanne Margarita on: 09/05/2021 09:53 AM   Modules accepted: Orders

## 2021-09-05 NOTE — Patient Instructions (Signed)
Medication Instructions:  Your physician recommends that you continue on your current medications as directed. Please refer to the Current Medication list given to you today.  *If you need a refill on your cardiac medications before your next appointment, please call your pharmacy*  Testing/Procedures: Your physician has requested that you have an exercise tolerance test. For further information please visit [email or web address]. Please also follow instruction sheet, as given.  Your physician has requested that you have a calcium score CT scan. There is a $99 fee for the scan.   Follow-Up: At [facility], you and your health needs are our priority.  As part of our continuing mission to provide you with exceptional heart care, we have created designated Provider Care Teams.  These Care Teams include your primary Cardiologist (physician) and Advanced Practice Providers (APPs -  Physician Assistants and Nurse Practitioners) who all work together to provide you with the care you need, when you need it.  Your next appointment:   1 year(s)  The format for your next appointment:   In Person  Provider:   Fransico Him, MD

## 2021-09-05 NOTE — Progress Notes (Signed)
Date:  09/05/2021   ID:  Colleen Kirby, DOB 25-Mar-1956, MRN [id number]   PCP:  Vernie Shanks, MD  Cardiologist:  Fransico Him, MD Electrophysiologist:  None   Chief Complaint:  Palpitations and PSVT  History of Present Illness:    Colleen Kirby is a 66 y.o. female with history of PSVT  And chronic LE edema felt secondary to chronic venous insufficiency.  2D echo in the past was normal.  Her PSVT has been controlled on BB.  When she was last seen by Dr. Saunders Revel she complained of snoring, fatigue and PND and she underwent home sleep study in Jan 2020 showing no OSA (AHI 0.8/hr).  She is here today for followup and is doing well. Occasionally she will have a mild cramp in her chest 1/10 that is nonexertional with no associated sx.  She denies any SOB, DOE, PND, orthopnea, LE edema, dizziness or syncope. She rarely will have some skipped beats but no SVT. She is compliant with her meds and is tolerating meds with no SE.     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   aspirin EC 81 MG tablet Take 81 mg by mouth daily.   cholecalciferol (VITAMIN D) 1000 units tablet Take 2,000 Units by mouth daily.    docusate sodium (COLACE) 100 MG capsule Take 100 mg by mouth daily  as needed for mild constipation.   estradiol (ESTRACE) 0.1 MG/GM vaginal cream Place 1 Applicatorful vaginally See admin instructions. Every 3 days   famotidine (PEPCID) 40 MG tablet Take 1 tablet by mouth as needed.   ibuprofen (ADVIL,MOTRIN) 200 MG tablet Take 200-400 mg by mouth every 6 (six) hours as needed. Pain   metoprolol succinate (TOPROL-XL) 25 MG 24 hr tablet TAKE 1/2 TABLET BY MOUTH EVERY DAY   Omega-3 Fatty Acids (OMEGA 3 PO) Take 1,250 mg by mouth daily.    Probiotic Product (ALIGN PO) Take 1 tablet by mouth every other day.    [DISCONTINUED] albuterol (PROVENTIL HFA;VENTOLIN HFA) 108 (90 BASE) MCG/ACT inhaler Inhale 2 puffs into the lungs every 6 (six) hours as needed. Wheezing   [DISCONTINUED] Calcium Citrate-Vitamin D (CITRACAL + D PO) Take 400 mg by mouth every other day. Vitamin C 600mg , Vitamin D 1000 units   [DISCONTINUED] hydrOXYzine (ATARAX/VISTARIL) 10 MG tablet Take 5 mg by mouth at bedtime. Patient takes at bedtime     Allergies:   Adhesive [tape]   Social History   Tobacco Use   Smoking status: Never   Smokeless tobacco: Never  Substance Use Topics   Alcohol use: No     Family Hx:  The patient's family history includes Arrhythmia in an other family member; Breast cancer in her paternal grandmother; Breast cancer (age of onset: 7) in her mother; Colon cancer in her paternal aunt; Congestive Heart Failure in her mother; Coronary artery disease in an other family member; Healthy in her brother; Heart disease in her father, maternal grandmother, and paternal grandfather; Hypertension in her brother and mother; Pneumonia in her maternal uncle; Prostate cancer in her cousin; Testicular cancer (age of onset: 59) in her son.  ROS:   Please see the history of present illness.     All other systems reviewed and are negative.   Labs/Other Tests and Data Reviewed:    Recent Labs: No results found for requested labs within last 8760 hours.   Recent Lipid Panel No  results found for: CHOL, TRIG, HDL, CHOLHDL, LDLCALC, LDLDIRECT  Wt Readings from Last 3 Encounters:  09/05/21 158 lb 3.2 oz (71.8 kg)  08/23/20 168 lb (76.2 kg)  12/14/19 167 lb (75.8 kg)     Objective:    Vital Signs:  BP 116/72    Pulse 72    Ht 5\' 6"  (1.676 m)    Wt 158 lb 3.2 oz (71.8 kg)    SpO2 98%    BMI 25.53 kg/m    GEN: Well nourished, well developed in no acute distress HEENT: Normal NECK: No JVD; No carotid bruits LYMPHATICS: No lymphadenopathy CARDIAC:RRR, no murmurs, rubs, gallops RESPIRATORY:  Clear to auscultation without rales, wheezing or rhonchi  ABDOMEN: Soft, non-tender, non-distended MUSCULOSKELETAL:  No edema; No deformity  SKIN: Warm and dry NEUROLOGIC:  Alert and oriented x 3 PSYCHIATRIC:  Normal affect    ASSESSMENT & PLAN:    1.  PSVT -She is maintaining NSR on exam and denies any palpitations -continue prescription drug management with Toprol XL 12.5mg  daily with PRN refills  2.  LE edema  - this has been well controlled - likely related to dietary indiscretion with Na as well as chronic venous insuff.  -reminded her to cut out added Na in her diet  -continue diuretics PRN  3.  Chronic venous insufficiency  -followed by Dr. Scot Dock and using knee-high compression hose   4.  Aortic atherosclerosis/Atypical CP -noted on Chest xray -I will get a coronary Ca score for risk assessment -ETT to rule out ischemia given atypical CP  Medication Adjustments/Labs and Tests Ordered: Current medicines are reviewed at length with the patient today.  Concerns regarding medicines are outlined above.  Tests Ordered: Orders Placed This Encounter  Procedures   EKG 12-Lead   Medication Changes: No orders of the defined types were placed in this encounter.   Disposition:  Follow up in 1 year(s)  Signed, Fransico Him, MD  09/05/2021 9:01 AM    [facility]

## 2021-09-26 DIAGNOSIS — Z85048 Personal history of other malignant neoplasm of rectum, rectosigmoid junction, and anus: Secondary | ICD-10-CM | POA: Diagnosis not present

## 2021-09-26 DIAGNOSIS — E559 Vitamin D deficiency, unspecified: Secondary | ICD-10-CM | POA: Diagnosis not present

## 2021-09-26 DIAGNOSIS — E7800 Pure hypercholesterolemia, unspecified: Secondary | ICD-10-CM | POA: Diagnosis not present

## 2021-09-29 DIAGNOSIS — E7800 Pure hypercholesterolemia, unspecified: Secondary | ICD-10-CM | POA: Diagnosis not present

## 2021-09-29 DIAGNOSIS — E559 Vitamin D deficiency, unspecified: Secondary | ICD-10-CM | POA: Diagnosis not present

## 2021-09-29 DIAGNOSIS — K219 Gastro-esophageal reflux disease without esophagitis: Secondary | ICD-10-CM | POA: Diagnosis not present

## 2021-09-29 DIAGNOSIS — I700 Atherosclerosis of aorta: Secondary | ICD-10-CM | POA: Diagnosis not present

## 2021-10-02 DIAGNOSIS — R1031 Right lower quadrant pain: Secondary | ICD-10-CM | POA: Diagnosis not present

## 2021-10-06 ENCOUNTER — Other Ambulatory Visit: Payer: Self-pay | Admitting: Obstetrics and Gynecology

## 2021-10-06 DIAGNOSIS — R1031 Right lower quadrant pain: Secondary | ICD-10-CM

## 2021-10-09 ENCOUNTER — Ambulatory Visit (INDEPENDENT_AMBULATORY_CARE_PROVIDER_SITE_OTHER)
Admission: RE | Admit: 2021-10-09 | Discharge: 2021-10-09 | Disposition: A | Discharge status: Home / Self Care | Payer: Self-pay | Source: Ambulatory Visit | Attending: Cardiology | Admitting: Cardiology

## 2021-10-09 ENCOUNTER — Ambulatory Visit (INDEPENDENT_AMBULATORY_CARE_PROVIDER_SITE_OTHER): Payer: BC Managed Care – PPO

## 2021-10-09 ENCOUNTER — Other Ambulatory Visit: Payer: Self-pay

## 2021-10-09 DIAGNOSIS — I471 Supraventricular tachycardia: Secondary | ICD-10-CM

## 2021-10-09 DIAGNOSIS — I872 Venous insufficiency (chronic) (peripheral): Secondary | ICD-10-CM

## 2021-10-09 DIAGNOSIS — R600 Localized edema: Secondary | ICD-10-CM

## 2021-10-09 DIAGNOSIS — I700 Atherosclerosis of aorta: Secondary | ICD-10-CM

## 2021-10-09 LAB — EXERCISE TOLERANCE TEST
Angina Index: 0
Duke Treadmill Score: 1
Estimated workload: 7
Exercise duration (min): 6 min
Exercise duration (sec): 0 s
MPHR: 154 {beats}/min
Peak HR: 155 {beats}/min
Percent HR: 100 %
Rest HR: 81 {beats}/min
ST Depression (mm): 1 mm

## 2021-10-17 ENCOUNTER — Telehealth: Payer: Self-pay | Admitting: Cardiology

## 2021-10-17 DIAGNOSIS — I700 Atherosclerosis of aorta: Secondary | ICD-10-CM

## 2021-10-17 MED ORDER — METOPROLOL TARTRATE 100 MG PO TABS: 100.0000 mg | ORAL_TABLET | Freq: Once | ORAL | 0 refills | Status: DC

## 2021-10-17 NOTE — Telephone Encounter (Signed)
? ?  Pt is calling to get cardiac scoring and ETT result ?

## 2021-10-17 NOTE — Telephone Encounter (Signed)
Sueanne Margarita, MD  ?10/09/2021  4:55 PM EST   ?  ?Abnormal stress test but suspect this is a false negative.  Please get coronary CTA  ? ?The patient has been notified of the result and verbalized understanding.  All questions (if any) were answered. ?Antonieta Iba, RN 10/17/2021 11:35 AM  ?Coronary CTA has  been ordered. Instructions have been reviewed. Will send patient a myChart message as well.  ?

## 2021-10-18 DIAGNOSIS — Z933 Colostomy status: Secondary | ICD-10-CM | POA: Diagnosis not present

## 2021-10-18 DIAGNOSIS — C20 Malignant neoplasm of rectum: Secondary | ICD-10-CM | POA: Diagnosis not present

## 2021-10-27 ENCOUNTER — Other Ambulatory Visit (HOSPITAL_COMMUNITY): Payer: Self-pay | Admitting: *Deleted

## 2021-10-27 ENCOUNTER — Telehealth (HOSPITAL_COMMUNITY): Payer: Self-pay | Admitting: *Deleted

## 2021-10-27 DIAGNOSIS — I700 Atherosclerosis of aorta: Secondary | ICD-10-CM | POA: Diagnosis not present

## 2021-10-27 NOTE — Telephone Encounter (Signed)
Reaching out to patient to offer assistance regarding upcoming cardiac imaging study; pt verbalizes understanding of appt date/time, parking situation and where to check in, pre-test NPO status and medications ordered, and verified current allergies; name and call back number provided for further questions should they arise ? ?Gordy Clement RN Navigator Cardiac Imaging ?[facility] ?[phone] office ?[phone] cell ? ?Patient to hold her daily metoprolol succinate the night before and take '100mg'$  metoprolol tartrate two hours prior to her cardiac CT scan.  She is aware to arrive at 11:30am for her noon scan. ?

## 2021-10-27 NOTE — Telephone Encounter (Signed)
Calling patient back to confirm that both cardiac CT and CT Pelvis can be done at the same time.  Both orders are linked for her appointment on Wednesday March 15. Patient verbalized understanding. ? ?Gordy Clement RN Navigator Cardiac Imaging ?[facility] ?[phone] Office ?[phone] Cell ? ?

## 2021-10-28 LAB — BASIC METABOLIC PANEL
BUN/Creatinine Ratio: 19 (ref 12–28)
BUN: 16 mg/dL (ref 8–27)
CO2: 26 mmol/L (ref 20–29)
Calcium: 10 mg/dL (ref 8.7–10.3)
Chloride: 99 mmol/L (ref 96–106)
Creatinine, Ser: 0.86 mg/dL (ref 0.57–1.00)
Glucose: 99 mg/dL (ref 70–99)
Potassium: 4.2 mmol/L (ref 3.5–5.2)
Sodium: 138 mmol/L (ref 134–144)
eGFR: 74 mL/min/{1.73_m2} (ref >59)

## 2021-10-29 ENCOUNTER — Encounter (HOSPITAL_COMMUNITY): Payer: Self-pay

## 2021-10-29 ENCOUNTER — Other Ambulatory Visit: Payer: Self-pay

## 2021-10-29 ENCOUNTER — Ambulatory Visit (HOSPITAL_COMMUNITY)
Admission: RE | Admit: 2021-10-29 | Discharge: 2021-10-29 | Disposition: A | Discharge status: Home / Self Care | Payer: BC Managed Care – PPO | Source: Ambulatory Visit | Attending: Cardiology | Admitting: Cardiology

## 2021-10-29 DIAGNOSIS — Z9049 Acquired absence of other specified parts of digestive tract: Secondary | ICD-10-CM | POA: Diagnosis not present

## 2021-10-29 DIAGNOSIS — R1031 Right lower quadrant pain: Secondary | ICD-10-CM | POA: Insufficient documentation

## 2021-10-29 DIAGNOSIS — I700 Atherosclerosis of aorta: Secondary | ICD-10-CM | POA: Diagnosis not present

## 2021-10-29 DIAGNOSIS — Z933 Colostomy status: Secondary | ICD-10-CM | POA: Diagnosis not present

## 2021-10-29 DIAGNOSIS — K435 Parastomal hernia without obstruction or  gangrene: Secondary | ICD-10-CM | POA: Diagnosis not present

## 2021-10-29 MED ORDER — IOHEXOL 350 MG/ML SOLN
95.0000 mL | Freq: Once | INTRAVENOUS | Status: AC | PRN
  Administered 2021-10-29: 95 mL via INTRAVENOUS

## 2021-10-29 MED ORDER — NITROGLYCERIN 0.4 MG SL SUBL: 0.8000 mg | SUBLINGUAL_TABLET | Freq: Once | SUBLINGUAL | Status: AC

## 2021-10-29 MED ORDER — NITROGLYCERIN 0.4 MG SL SUBL
SUBLINGUAL_TABLET | SUBLINGUAL | Status: AC
  Administered 2021-10-29: 0.8 mg via SUBLINGUAL
  Filled 2021-10-29: qty 2

## 2021-10-30 ENCOUNTER — Encounter: Payer: Self-pay | Admitting: Cardiology

## 2021-10-31 ENCOUNTER — Other Ambulatory Visit: Payer: BC Managed Care – PPO

## 2021-11-27 DIAGNOSIS — H2513 Age-related nuclear cataract, bilateral: Secondary | ICD-10-CM | POA: Diagnosis not present

## 2021-11-27 DIAGNOSIS — H52203 Unspecified astigmatism, bilateral: Secondary | ICD-10-CM | POA: Diagnosis not present

## 2021-12-03 DIAGNOSIS — D2261 Melanocytic nevi of right upper limb, including shoulder: Secondary | ICD-10-CM | POA: Diagnosis not present

## 2021-12-03 DIAGNOSIS — B078 Other viral warts: Secondary | ICD-10-CM | POA: Diagnosis not present

## 2021-12-03 DIAGNOSIS — L821 Other seborrheic keratosis: Secondary | ICD-10-CM | POA: Diagnosis not present

## 2021-12-03 DIAGNOSIS — D225 Melanocytic nevi of trunk: Secondary | ICD-10-CM | POA: Diagnosis not present

## 2021-12-03 DIAGNOSIS — D2271 Melanocytic nevi of right lower limb, including hip: Secondary | ICD-10-CM | POA: Diagnosis not present

## 2021-12-09 ENCOUNTER — Other Ambulatory Visit: Payer: Self-pay | Admitting: Obstetrics and Gynecology

## 2021-12-09 ENCOUNTER — Other Ambulatory Visit: Payer: Self-pay | Admitting: Obstetrics & Gynecology

## 2021-12-09 DIAGNOSIS — Z1231 Encounter for screening mammogram for malignant neoplasm of breast: Secondary | ICD-10-CM

## 2021-12-11 IMAGING — US US THYROID
1 series · 14 of 25 positions shown · non-contrast
Comparison: None.

CLINICAL DATA: Neck fullness

EXAM:
THYROID ULTRASOUND
TECHNIQUE: Ultrasound examination of the thyroid gland and adjacent soft
tissues was performed.

[Series 1: us thyroid · 0.05mm/px · 40 acquisitions, 14 frames shown]
[im 1/40]
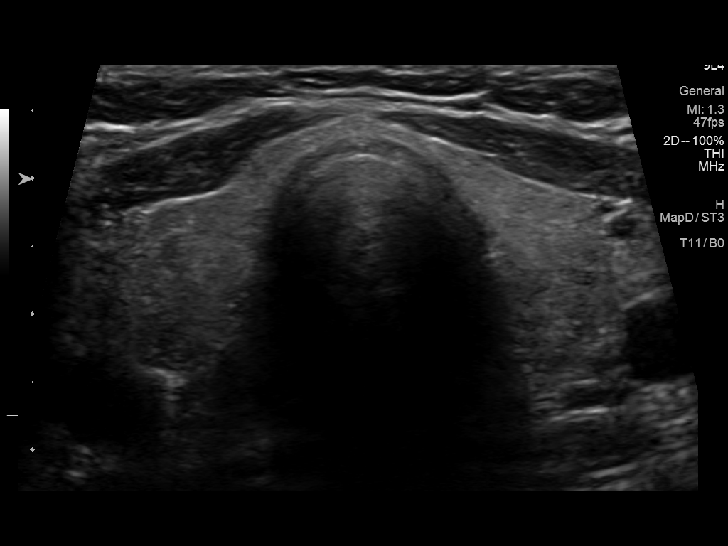
[im 4/40]
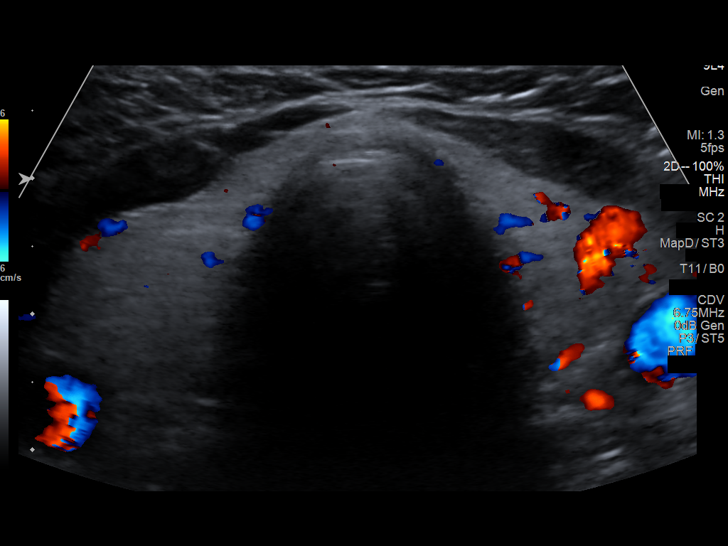
[im 7/40]
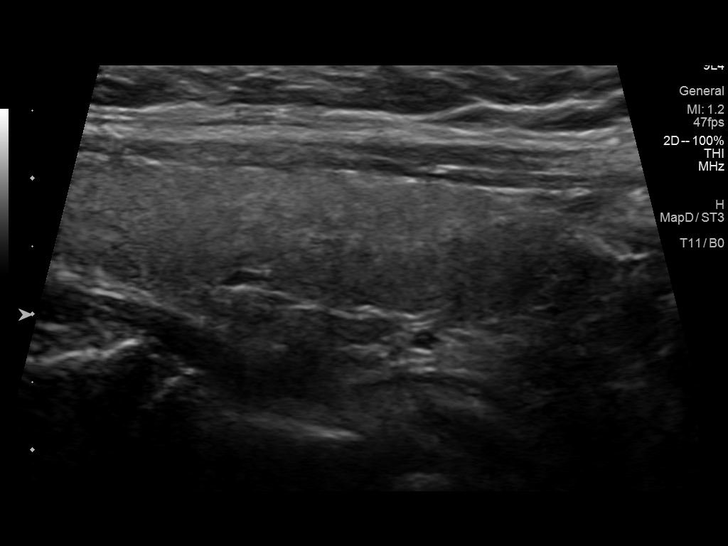
[im 10/40]
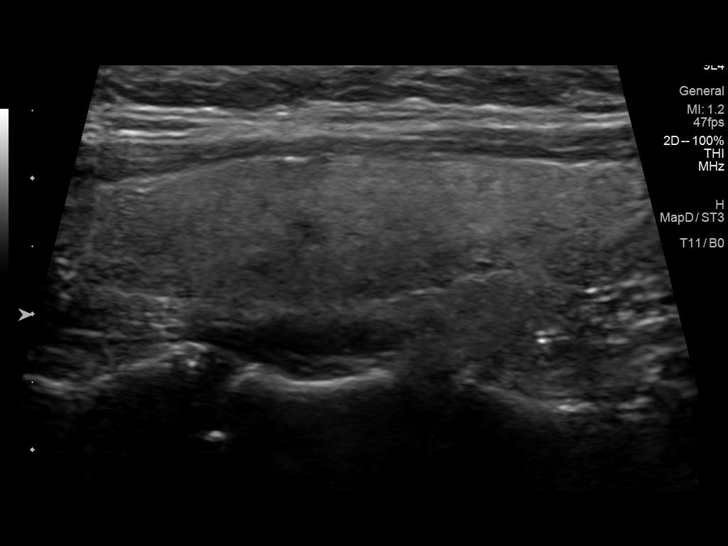
[im 14/40]
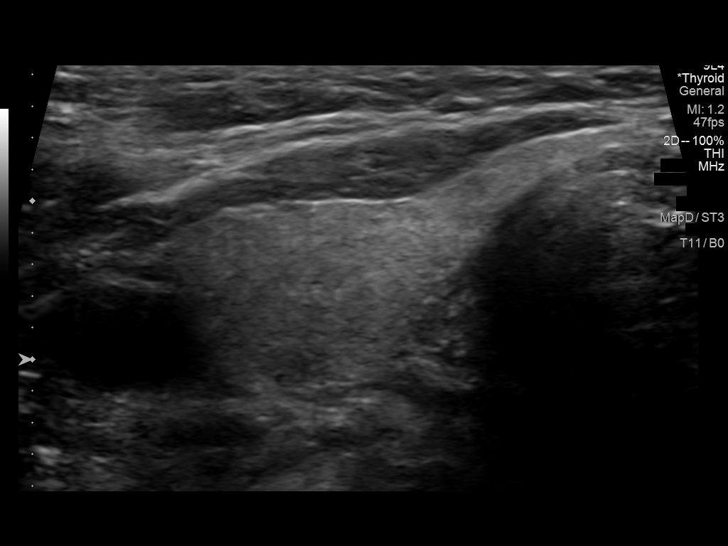
[im 15/40]
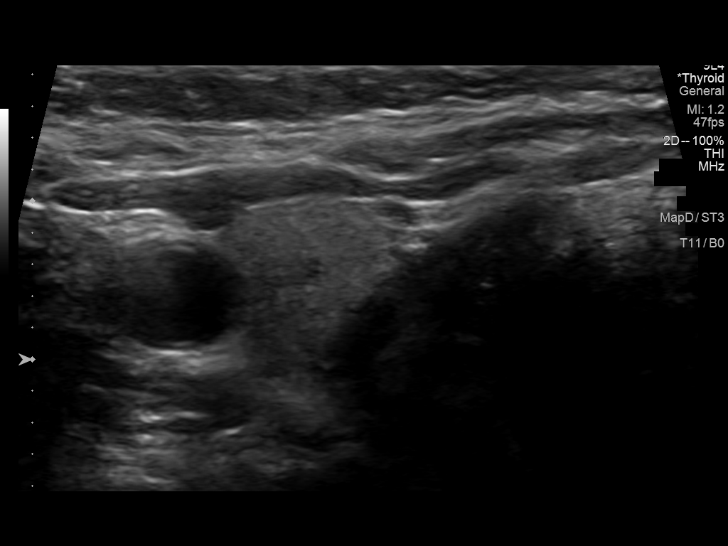
[im 18/40]
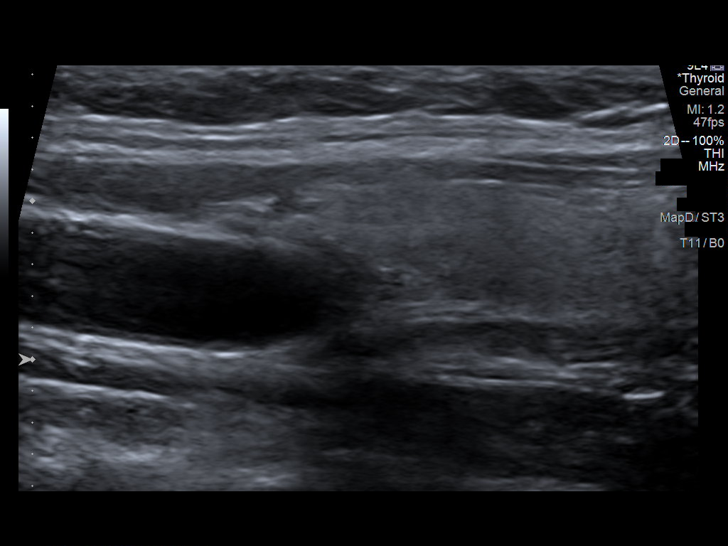
[im 22/40]
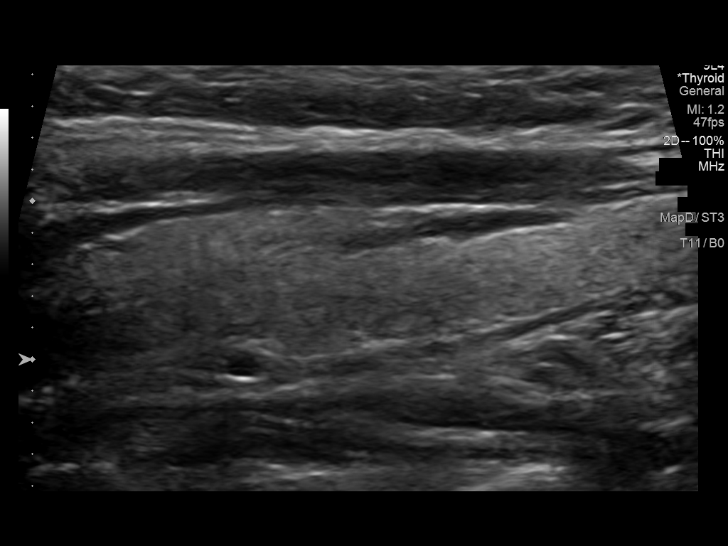
[im 25/40]
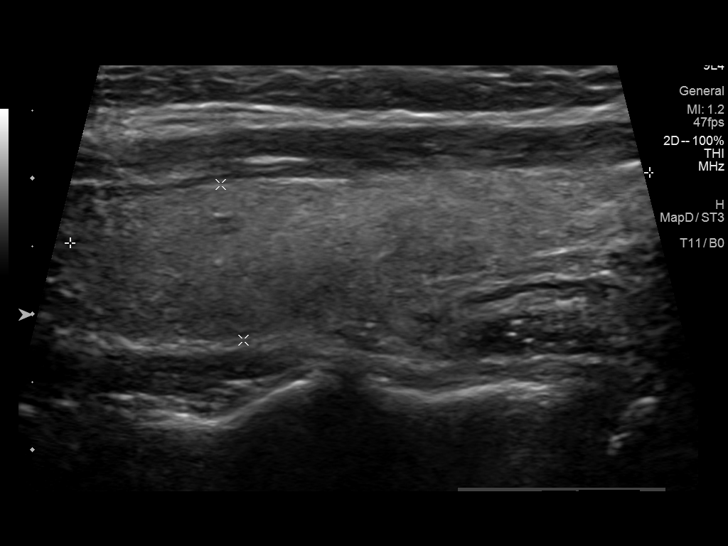
[im 27/40]
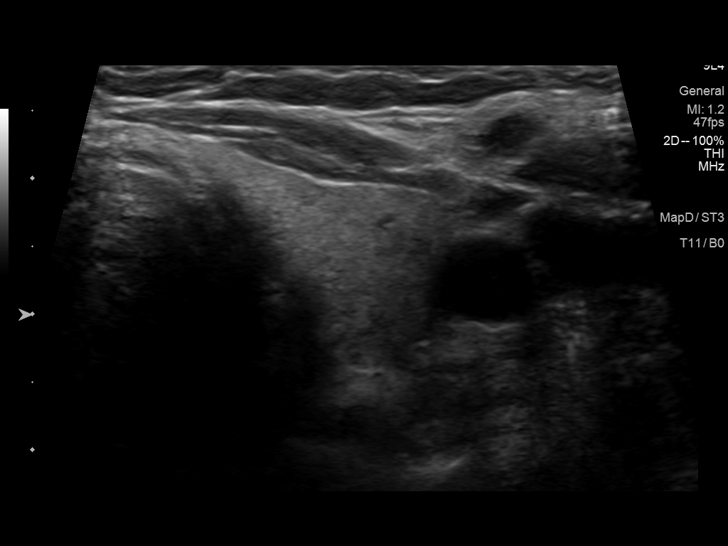
[im 30/40]
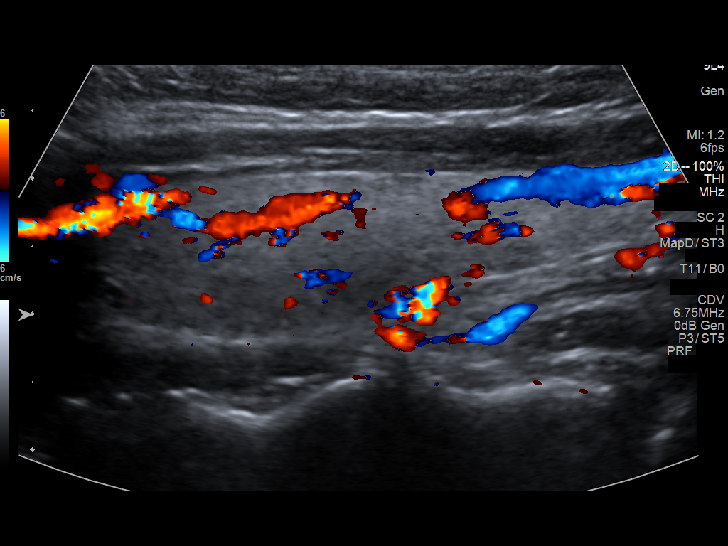
[im 33/40]
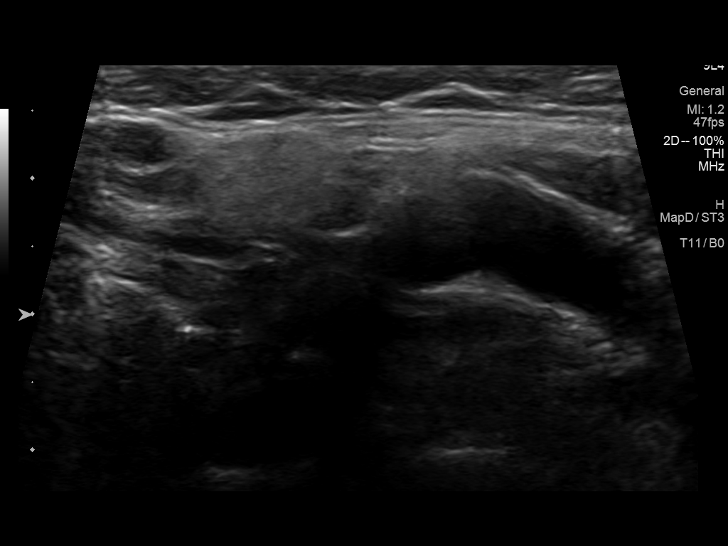
[im 36/40]
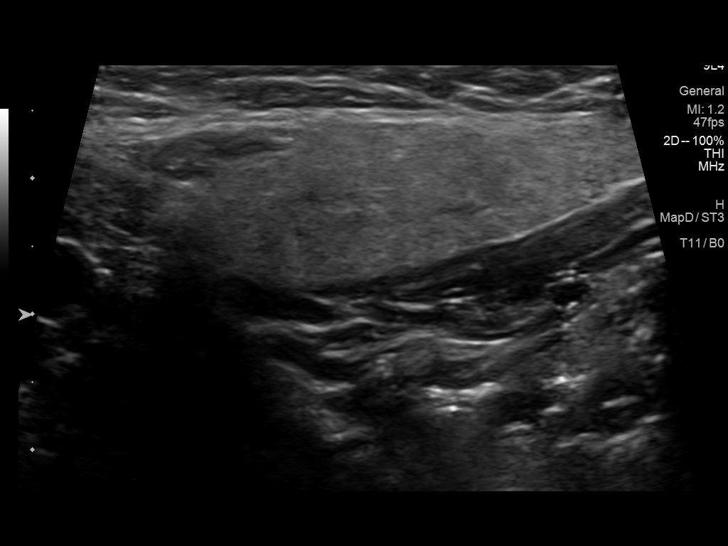
[im 40/40]
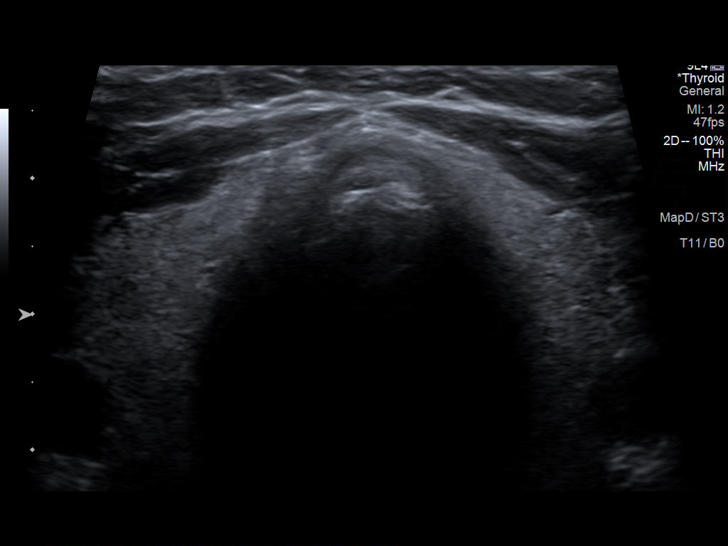

[14 of 25 positions shown; findings below may reference images not displayed]

FINDINGS: Parenchymal Echotexture: Mildly heterogeneous

Isthmus: 0.2 cm

Right lobe: 4.8 x 1.4 x 2.0 cm

Left lobe: 4.3 x 1.2 x 1.4 cm

_________________________________________________________

Estimated total number of nodules >/= 1 cm: 0

Number of spongiform nodules >/=  2 cm not described below (TR1): 0

Number of mixed cystic and solid nodules >/= 1.5 cm not described
below (TR2): 0

_________________________________________________________

No discrete nodules are seen within the thyroid gland.
IMPRESSION: No significant abnormality of the thyroid. If the patient's symptoms
continue, consider further evaluation with contrast-enhanced soft
tissue neck CT.

The above is in keeping with the ACR TI-RADS recommendations - [HOSPITAL] 3831;[DATE].

## 2022-01-05 DIAGNOSIS — Z8616 Personal history of COVID-19: Secondary | ICD-10-CM | POA: Diagnosis not present

## 2022-01-05 DIAGNOSIS — J3489 Other specified disorders of nose and nasal sinuses: Secondary | ICD-10-CM | POA: Diagnosis not present

## 2022-01-05 DIAGNOSIS — R498 Other voice and resonance disorders: Secondary | ICD-10-CM | POA: Diagnosis not present

## 2022-01-05 DIAGNOSIS — R198 Other specified symptoms and signs involving the digestive system and abdomen: Secondary | ICD-10-CM | POA: Diagnosis not present

## 2022-01-09 ENCOUNTER — Ambulatory Visit
Admission: RE | Admit: 2022-01-09 | Discharge: 2022-01-09 | Disposition: A | Discharge status: Home / Self Care | Payer: BC Managed Care – PPO | Source: Ambulatory Visit | Attending: Obstetrics and Gynecology | Admitting: Obstetrics and Gynecology

## 2022-01-09 ENCOUNTER — Ambulatory Visit: Payer: BC Managed Care – PPO

## 2022-01-09 DIAGNOSIS — Z1231 Encounter for screening mammogram for malignant neoplasm of breast: Secondary | ICD-10-CM

## 2022-03-11 DIAGNOSIS — Z933 Colostomy status: Secondary | ICD-10-CM | POA: Diagnosis not present

## 2022-03-11 DIAGNOSIS — L570 Actinic keratosis: Secondary | ICD-10-CM | POA: Diagnosis not present

## 2022-03-11 DIAGNOSIS — L718 Other rosacea: Secondary | ICD-10-CM | POA: Diagnosis not present

## 2022-03-11 DIAGNOSIS — L814 Other melanin hyperpigmentation: Secondary | ICD-10-CM | POA: Diagnosis not present

## 2022-03-11 DIAGNOSIS — C20 Malignant neoplasm of rectum: Secondary | ICD-10-CM | POA: Diagnosis not present

## 2022-03-18 DIAGNOSIS — N3040 Irradiation cystitis without hematuria: Secondary | ICD-10-CM | POA: Diagnosis not present

## 2022-03-18 DIAGNOSIS — N3946 Mixed incontinence: Secondary | ICD-10-CM | POA: Diagnosis not present

## 2022-04-18 DIAGNOSIS — N3000 Acute cystitis without hematuria: Secondary | ICD-10-CM | POA: Diagnosis not present

## 2022-04-18 DIAGNOSIS — R300 Dysuria: Secondary | ICD-10-CM | POA: Diagnosis not present

## 2022-04-22 DIAGNOSIS — N3946 Mixed incontinence: Secondary | ICD-10-CM | POA: Diagnosis not present

## 2022-04-22 DIAGNOSIS — N3040 Irradiation cystitis without hematuria: Secondary | ICD-10-CM | POA: Diagnosis not present

## 2022-04-22 DIAGNOSIS — R278 Other lack of coordination: Secondary | ICD-10-CM | POA: Diagnosis not present

## 2022-05-06 DIAGNOSIS — R278 Other lack of coordination: Secondary | ICD-10-CM | POA: Diagnosis not present

## 2022-05-06 DIAGNOSIS — M6281 Muscle weakness (generalized): Secondary | ICD-10-CM | POA: Diagnosis not present

## 2022-05-06 DIAGNOSIS — N3946 Mixed incontinence: Secondary | ICD-10-CM | POA: Diagnosis not present

## 2022-05-26 DIAGNOSIS — M6281 Muscle weakness (generalized): Secondary | ICD-10-CM | POA: Diagnosis not present

## 2022-05-26 DIAGNOSIS — N3946 Mixed incontinence: Secondary | ICD-10-CM | POA: Diagnosis not present

## 2022-05-26 DIAGNOSIS — R278 Other lack of coordination: Secondary | ICD-10-CM | POA: Diagnosis not present

## 2022-06-18 DIAGNOSIS — R278 Other lack of coordination: Secondary | ICD-10-CM | POA: Diagnosis not present

## 2022-06-18 DIAGNOSIS — M6281 Muscle weakness (generalized): Secondary | ICD-10-CM | POA: Diagnosis not present

## 2022-06-18 DIAGNOSIS — N3946 Mixed incontinence: Secondary | ICD-10-CM | POA: Diagnosis not present

## 2022-06-23 DIAGNOSIS — Z23 Encounter for immunization: Secondary | ICD-10-CM | POA: Diagnosis not present

## 2022-06-25 DIAGNOSIS — Z01419 Encounter for gynecological examination (general) (routine) without abnormal findings: Secondary | ICD-10-CM | POA: Diagnosis not present

## 2022-06-25 DIAGNOSIS — Z124 Encounter for screening for malignant neoplasm of cervix: Secondary | ICD-10-CM | POA: Diagnosis not present

## 2022-06-25 DIAGNOSIS — Z6827 Body mass index (BMI) 27.0-27.9, adult: Secondary | ICD-10-CM | POA: Diagnosis not present

## 2022-06-29 ENCOUNTER — Other Ambulatory Visit: Payer: Self-pay | Admitting: Obstetrics and Gynecology

## 2022-06-29 DIAGNOSIS — N6320 Unspecified lump in the left breast, unspecified quadrant: Secondary | ICD-10-CM | POA: Diagnosis not present

## 2022-06-29 DIAGNOSIS — N6452 Nipple discharge: Secondary | ICD-10-CM

## 2022-07-08 DIAGNOSIS — Z049 Encounter for examination and observation for unspecified reason: Secondary | ICD-10-CM | POA: Diagnosis not present

## 2022-07-08 DIAGNOSIS — N6489 Other specified disorders of breast: Secondary | ICD-10-CM | POA: Diagnosis not present

## 2022-07-08 DIAGNOSIS — R92332 Mammographic heterogeneous density, left breast: Secondary | ICD-10-CM | POA: Diagnosis not present

## 2022-07-08 DIAGNOSIS — N6452 Nipple discharge: Secondary | ICD-10-CM | POA: Diagnosis not present

## 2022-07-08 DIAGNOSIS — N644 Mastodynia: Secondary | ICD-10-CM | POA: Diagnosis not present

## 2022-07-14 ENCOUNTER — Other Ambulatory Visit: Payer: BC Managed Care – PPO

## 2022-07-17 DIAGNOSIS — N3946 Mixed incontinence: Secondary | ICD-10-CM | POA: Diagnosis not present

## 2022-07-17 DIAGNOSIS — N3040 Irradiation cystitis without hematuria: Secondary | ICD-10-CM | POA: Diagnosis not present

## 2022-07-17 DIAGNOSIS — R3912 Poor urinary stream: Secondary | ICD-10-CM | POA: Diagnosis not present

## 2022-07-23 DIAGNOSIS — M6281 Muscle weakness (generalized): Secondary | ICD-10-CM | POA: Diagnosis not present

## 2022-07-23 DIAGNOSIS — N3946 Mixed incontinence: Secondary | ICD-10-CM | POA: Diagnosis not present

## 2022-07-23 DIAGNOSIS — B9620 Unspecified Escherichia coli [E. coli] as the cause of diseases classified elsewhere: Secondary | ICD-10-CM | POA: Diagnosis not present

## 2022-07-23 DIAGNOSIS — N3040 Irradiation cystitis without hematuria: Secondary | ICD-10-CM | POA: Diagnosis not present

## 2022-07-23 DIAGNOSIS — N390 Urinary tract infection, site not specified: Secondary | ICD-10-CM | POA: Diagnosis not present

## 2022-08-11 DIAGNOSIS — N390 Urinary tract infection, site not specified: Secondary | ICD-10-CM | POA: Diagnosis not present

## 2022-08-11 DIAGNOSIS — R399 Unspecified symptoms and signs involving the genitourinary system: Secondary | ICD-10-CM | POA: Diagnosis not present

## 2022-08-31 DIAGNOSIS — N3040 Irradiation cystitis without hematuria: Secondary | ICD-10-CM | POA: Diagnosis not present

## 2022-08-31 DIAGNOSIS — R8271 Bacteriuria: Secondary | ICD-10-CM | POA: Diagnosis not present

## 2022-08-31 DIAGNOSIS — R300 Dysuria: Secondary | ICD-10-CM | POA: Diagnosis not present

## 2022-09-25 DIAGNOSIS — Z933 Colostomy status: Secondary | ICD-10-CM | POA: Diagnosis not present

## 2022-09-25 DIAGNOSIS — C20 Malignant neoplasm of rectum: Secondary | ICD-10-CM | POA: Diagnosis not present

## 2022-10-08 DIAGNOSIS — M6281 Muscle weakness (generalized): Secondary | ICD-10-CM | POA: Diagnosis not present

## 2022-10-08 DIAGNOSIS — R278 Other lack of coordination: Secondary | ICD-10-CM | POA: Diagnosis not present

## 2022-10-08 DIAGNOSIS — N3946 Mixed incontinence: Secondary | ICD-10-CM | POA: Diagnosis not present

## 2022-11-03 DIAGNOSIS — N3946 Mixed incontinence: Secondary | ICD-10-CM | POA: Diagnosis not present

## 2022-11-03 DIAGNOSIS — M6281 Muscle weakness (generalized): Secondary | ICD-10-CM | POA: Diagnosis not present

## 2022-11-03 DIAGNOSIS — M6289 Other specified disorders of muscle: Secondary | ICD-10-CM | POA: Diagnosis not present

## 2022-11-03 DIAGNOSIS — M62838 Other muscle spasm: Secondary | ICD-10-CM | POA: Diagnosis not present

## 2022-11-12 ENCOUNTER — Other Ambulatory Visit: Payer: Self-pay | Admitting: Cardiology

## 2023-01-08 ENCOUNTER — Other Ambulatory Visit: Payer: Self-pay

## 2023-01-08 ENCOUNTER — Telehealth: Payer: Self-pay

## 2023-01-08 DIAGNOSIS — I83893 Varicose veins of bilateral lower extremities with other complications: Secondary | ICD-10-CM

## 2023-01-08 NOTE — Telephone Encounter (Signed)
Pt called with questions about sclerotherapy. She is interested in this and possibly laser ablation, as her previous study showed reflux. She has decided to do an updated reflux study, as her previous one is from 2021. She has been scheduled for that and to see MD.

## 2023-01-22 ENCOUNTER — Ambulatory Visit: Payer: Medicare Other | Attending: Cardiology | Admitting: Cardiology

## 2023-01-22 ENCOUNTER — Encounter: Payer: Self-pay | Admitting: Cardiology

## 2023-01-22 VITALS — BP 112/60 | HR 78 | Ht 66.0 in | Wt 168.8 lb

## 2023-01-22 DIAGNOSIS — R600 Localized edema: Secondary | ICD-10-CM | POA: Diagnosis present

## 2023-01-22 DIAGNOSIS — I700 Atherosclerosis of aorta: Secondary | ICD-10-CM | POA: Diagnosis present

## 2023-01-22 DIAGNOSIS — I872 Venous insufficiency (chronic) (peripheral): Secondary | ICD-10-CM

## 2023-01-22 DIAGNOSIS — I471 Supraventricular tachycardia, unspecified: Secondary | ICD-10-CM | POA: Diagnosis present

## 2023-01-22 NOTE — Progress Notes (Signed)
Date:  01/22/2023   ID:  Colleen Kirby, DOB January 21, 1956, MRN [id number]   PCP:  Ileana Ladd, MD (Inactive)  Cardiologist:  Armanda Magic, MD Electrophysiologist:  None   Chief Complaint:  Palpitations and PSVT  History of Present Illness:    Colleen Kirby is a 67 y.o. female with history of PSVT and chronic LE edema felt secondary to chronic venous insufficiency.  2D echo in the past was normal.  Her PSVT has been controlled on BB.  When she was last seen by Dr. Okey Dupre she complained of snoring, fatigue and PND and she underwent home sleep study in Jan 2020 showing no OSA (AHI 0.8/hr). She had a coronary CTA with no coronary Ca and no CAD.  She is here today for followup and is doing well.  She did notice 2 episodes of very mild chest discomfort but not pain while walking that lasted 2 minutes and then stopped on its own.  She denies any SOB, DOE, PND, orthopnea, dizziness, or syncope. She has stable chronic LE edema. Her palpitations are very stable and has been very infrequent.  Once in a while she will notice 1 hard beat.  When she gets the palpitations it will only last a few minutes.  She is compliant with her meds and is tolerating meds with no SE.    Prior CV studies:   The following studies were reviewed today:  EKG was performed in the office today and showed NSR with nonspecific ST abnormality  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   Family history of breast cancer    Family history of colon cancer    Family history of testicular cancer    GERD (gastroesophageal reflux disease)    History of DVT (deep vein thrombosis)    Palpitations    Personal history of chemotherapy 2011   colon   Rectal cancer ([facility])    Past Surgical History:  Procedure Laterality Date   SMALL INTESTINE SURGERY       Current Meds  Medication Sig   acetaminophen (TYLENOL) 500 MG  tablet Take 500 mg by mouth every 6 (six) hours as needed for mild pain or headache.   aspirin EC 81 MG tablet Take 81 mg by mouth daily.   cholecalciferol (VITAMIN D) 1000 units tablet Take 2,000 Units by mouth daily.    docusate sodium (COLACE) 100 MG capsule Take 100 mg by mouth daily as needed for mild constipation.   estradiol (ESTRACE) 0.1 MG/GM vaginal cream Place 1 Applicatorful vaginally See admin instructions. Every 3 days   famotidine (PEPCID) 40 MG tablet Take 1 tablet by mouth as needed.   ibuprofen (ADVIL,MOTRIN) 200 MG tablet Take 200-400 mg by mouth every 6 (six) hours as needed. Pain   metoprolol succinate (TOPROL-XL) 25 MG 24 hr tablet TAKE 1/2 TABLET BY MOUTH EVERY DAY   Omega-3 Fatty Acids (OMEGA 3 PO) Take 1,250 mg by mouth daily.    Probiotic Product (ALIGN PO) Take 1 tablet by mouth every other day.      Allergies:   Adhesive [tape]   Social History   Tobacco Use   Smoking status: Never   Smokeless tobacco: Never  Substance Use Topics   Alcohol use: No     Family Hx: The patient's family history includes Arrhythmia in an other family member; Breast cancer in her  paternal grandmother; Breast cancer (age of onset: 39) in her mother; Colon cancer in her paternal aunt; Congestive Heart Failure in her mother; Coronary artery disease in an other family member; Healthy in her brother; Heart disease in her father, maternal grandmother, and paternal grandfather; Hypertension in her brother and mother; Pneumonia in her maternal uncle; Prostate cancer in her cousin; Testicular cancer (age of onset: 11) in her son.  ROS:   Please see the history of present illness.     All other systems reviewed and are negative.   Labs/Other Tests and Data Reviewed:    Recent Labs: No results found for requested labs within last 365 days.   Recent Lipid Panel No results found for: "CHOL", "TRIG", "HDL", "CHOLHDL", "LDLCALC", "LDLDIRECT"  Wt Readings from Last 3 Encounters:   01/22/23 168 lb 12.8 oz (76.6 kg)  09/05/21 158 lb 3.2 oz (71.8 kg)  08/23/20 168 lb (76.2 kg)     Objective:    Vital Signs:  BP 112/60   Pulse 78   Ht 5\' 6"  (1.676 m)   Wt 168 lb 12.8 oz (76.6 kg)   SpO2 100%   BMI 27.25 kg/m    GEN: Well nourished, well developed in no acute distress HEENT: Normal NECK: No JVD; No carotid bruits LYMPHATICS: No lymphadenopathy CARDIAC:RRR, no murmurs, rubs, gallops RESPIRATORY:  Clear to auscultation without rales, wheezing or rhonchi  ABDOMEN: Soft, non-tender, non-distended MUSCULOSKELETAL:  No edema; No deformity  SKIN: Warm and dry NEUROLOGIC:  Alert and oriented x 3 PSYCHIATRIC:  Normal affect   ASSESSMENT & PLAN:    1.  PSVT -She is maintaining normal his rhythm on exam today with very rare and short lived papitations -Continue prescription drug management Toprol-XL 12.5 mg daily with as needed refills  2.  LE edema  -Lower extremity edema is controlled on as needed diuretics as well as avoiding sodium in her diet and compression hose -continue diuretics PRN  3.  Chronic venous insufficiency  -followed by Dr. Edilia Bo and using knee-high compression hose   4.  Aortic atherosclerosis/Atypical CP -noted on Chest xray -Coronary CTA 10/29/2021 demonstrated coronary calcium score of 0 with normal coronary arteries and no evidence of CAD -I do not think her atypical chest discomfort is related to her heart  Medication Adjustments/Labs and Tests Ordered: Current medicines are reviewed at length with the patient today.  Concerns regarding medicines are outlined above.  Tests Ordered: Orders Placed This Encounter  Procedures   EKG 12-Lead   Medication Changes: No orders of the defined types were placed in this encounter.   Disposition:  Follow up in 1 year(s)  Signed, Armanda Magic, MD  01/22/2023 10:08 AM    [facility]

## 2023-01-22 NOTE — Patient Instructions (Signed)
Medication Instructions:  Your physician recommends that you continue on your current medications as directed. Please refer to the Current Medication list given to you today.  *If you need a refill on your cardiac medications before your next appointment, please call your pharmacy*   Lab Work: None.  If you have labs (blood work) drawn today and your tests are completely normal, you will receive your results only by: MyChart Message (if you have MyChart) OR A paper copy in the mail If you have any lab test that is abnormal or we need to change your treatment, we will call you to review the results.   Testing/Procedures: None.   Follow-Up: At [facility], you and your health needs are our priority.  As part of our continuing mission to provide you with exceptional heart care, we have created designated Provider Care Teams.  These Care Teams include your primary Cardiologist (physician) and Advanced Practice Providers (APPs -  Physician Assistants and Nurse Practitioners) who all work together to provide you with the care you need, when you need it.  We recommend signing up for the patient portal called "MyChart".  Sign up information is provided on this After Visit Summary.  MyChart is used to connect with patients for Virtual Visits (Telemedicine).  Patients are able to view lab/test results, encounter notes, upcoming appointments, etc.  Non-urgent messages can be sent to your provider as well.   To learn more about what you can do with MyChart, go to [email or web address].    Your next appointment:   1 year(s)  Provider:   Traci Turner, MD     

## 2023-02-09 ENCOUNTER — Other Ambulatory Visit: Payer: Self-pay | Admitting: *Deleted

## 2023-02-09 DIAGNOSIS — I83893 Varicose veins of bilateral lower extremities with other complications: Secondary | ICD-10-CM

## 2023-02-09 NOTE — Progress Notes (Signed)
Reflux study order placed.

## 2023-02-24 ENCOUNTER — Ambulatory Visit: Payer: Medicare Other | Admitting: Vascular Surgery

## 2023-02-24 ENCOUNTER — Ambulatory Visit (HOSPITAL_COMMUNITY): Payer: Medicare Other

## 2023-03-02 ENCOUNTER — Ambulatory Visit: Payer: BC Managed Care – PPO | Admitting: Cardiology

## 2023-03-04 ENCOUNTER — Ambulatory Visit (HOSPITAL_COMMUNITY)
Admission: RE | Admit: 2023-03-04 | Discharge: 2023-03-04 | Disposition: A | Discharge status: Home / Self Care | Payer: Medicare Other | Source: Ambulatory Visit | Attending: Vascular Surgery | Admitting: Vascular Surgery

## 2023-03-04 DIAGNOSIS — I83893 Varicose veins of bilateral lower extremities with other complications: Secondary | ICD-10-CM | POA: Diagnosis present

## 2023-03-10 ENCOUNTER — Encounter: Payer: Self-pay | Admitting: Vascular Surgery

## 2023-03-10 ENCOUNTER — Ambulatory Visit (INDEPENDENT_AMBULATORY_CARE_PROVIDER_SITE_OTHER): Payer: Medicare Other | Admitting: Vascular Surgery

## 2023-03-10 VITALS — BP 120/74 | HR 72 | Temp 97.5°F | Resp 20 | Ht 66.0 in | Wt 168.0 lb

## 2023-03-10 DIAGNOSIS — I872 Venous insufficiency (chronic) (peripheral): Secondary | ICD-10-CM

## 2023-03-10 DIAGNOSIS — I8393 Asymptomatic varicose veins of bilateral lower extremities: Secondary | ICD-10-CM

## 2023-03-10 NOTE — Progress Notes (Signed)
Patient ID: Colleen Kirby, female   DOB: [birth year], 67 y.o.   MRN: [id number]  Reason for Consult: New Patient (Initial Visit)   Referred by Vicki Mallet*  Subjective:     HPI:  Colleen Kirby is a 67 y.o. female has been evaluated here for varicose veins of the left lower extremity was recommended knee-high compression socks which she does intermittently wear.  She states that her varicosities have gotten worse particularly the reticular veins on the medial leg and lateral foot she also has some varicosities on the ankle.  Her mother and father both had varicosities.  She denies any history of previous blood clots.  She states that she does have occasional swelling of her leg particular when she has been on it for any amount of time.  She does not have any tissue loss or ulceration and has never had venous interventions in the past.  She does have occasional cramping of the left leg particularly at night.  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   Family history of breast cancer    Family history of colon cancer    Family history of testicular cancer    GERD (gastroesophageal reflux disease)    History of DVT (deep vein thrombosis)    Palpitations    Personal history of chemotherapy 2011   colon   Rectal cancer (HCC)    Family History  Problem Relation Age of Onset   Congestive Heart Failure Mother    Hypertension Mother    Breast cancer Mother 10       & again @ 67   Heart disease Father    Hypertension Brother    Healthy Brother    Pneumonia Maternal Uncle        HIV related   Colon cancer Paternal Aunt        died in her 41s   Heart disease Maternal Grandmother    Breast cancer Paternal Grandmother        dx in her 55s, died at 33   Heart disease Paternal Grandfather    Prostate cancer Cousin        paternal cousin with prostate cancer in his  28s   Testicular cancer Son 71   Coronary artery disease Other    Arrhythmia Other    Past Surgical History:  Procedure Laterality Date   SMALL INTESTINE SURGERY      Short Social History:  Social History   Tobacco Use   Smoking status: Never   Smokeless tobacco: Never  Substance Use Topics   Alcohol use: No    Allergies  Allergen Reactions   Adhesive [Tape] Itching and Rash    Current Outpatient Medications  Medication Sig Dispense Refill   acetaminophen (TYLENOL) 500 MG tablet Take 500 mg by mouth every 6 (six) hours as needed for mild pain or headache.     aspirin EC 81 MG tablet Take 81 mg by mouth daily.     cholecalciferol (VITAMIN D) 1000 units tablet Take 2,000 Units by mouth daily.      docusate sodium (COLACE) 100 MG capsule Take 100 mg by mouth daily as needed for mild constipation.     estradiol (ESTRACE) 0.1 MG/GM vaginal cream Place 1 Applicatorful vaginally See admin instructions. Every 3 days     famotidine (PEPCID) 40 MG tablet Take 1 tablet by  mouth as needed.     ibuprofen (ADVIL,MOTRIN) 200 MG tablet Take 200-400 mg by mouth every 6 (six) hours as needed. Pain     metoprolol succinate (TOPROL-XL) 25 MG 24 hr tablet TAKE 1/2 TABLET BY MOUTH EVERY DAY 45 tablet 3   Omega-3 Fatty Acids (OMEGA 3 PO) Take 1,250 mg by mouth daily.      Probiotic Product (ALIGN PO) Take 1 tablet by mouth every other day.      metoprolol tartrate (LOPRESSOR) 100 MG tablet Take 1 tablet (100 mg total) by mouth once for 1 dose. Take 1 tablet two hours prior to CT scan. 1 tablet 0   No current facility-administered medications for this visit.    Review of Systems  Constitutional:  Constitutional negative. HENT: HENT negative.  Eyes: Eyes negative.  Respiratory: Respiratory negative.  Cardiovascular: Positive for leg swelling.  Musculoskeletal: Positive for leg pain.  Skin: Skin negative.  Neurological: Neurological negative. Hematologic: Hematologic/lymphatic negative.   Psychiatric: Psychiatric negative.        Objective:  Objective  Vitals:   03/10/23 0829  BP: 120/74  Pulse: 72  Resp: 20  Temp: (!) 97.5 F (36.4 C)  SpO2: 98%    Physical Exam HENT:     Head: Normocephalic.     Nose: Nose normal.  Cardiovascular:     Pulses: Normal pulses.  Pulmonary:     Effort: Pulmonary effort is normal.  Musculoskeletal:     Cervical back: Normal range of motion.     Right lower leg: Edema present.     Left lower leg: Edema present.  Skin:    Capillary Refill: Capillary refill takes less than 2 seconds.  Neurological:     General: No focal deficit present.     Mental Status: She is alert.  Psychiatric:        Mood and Affect: Mood normal.        Thought Content: Thought content normal.        Judgment: Judgment normal.        Data: Venous Reflux Times  +-----+---------+------+-----------+------------+--------+  RIGHTReflux NoRefluxReflux TimeDiameter cmsComments                 Yes                                   +-----+---------+------+-----------+------------+--------+                                                      +-----+---------+------+-----------+------------+--------+     +--------------+---------+------+-----------+------------+--------+  LEFT         Reflux NoRefluxReflux TimeDiameter cmsComments                          Yes                                   +--------------+---------+------+-----------+------------+--------+  CFV                    yes   >1 second                       +--------------+---------+------+-----------+------------+--------+  FV  mid                  yes   >1 second                       +--------------+---------+------+-----------+------------+--------+  Popliteal    no                                              +--------------+---------+------+-----------+------------+--------+  GSV at [facility]              yes    >500 ms       0.76              +--------------+---------+------+-----------+------------+--------+  GSV prox thigh          yes    >500 ms      0.68              +--------------+---------+------+-----------+------------+--------+  GSV mid thigh           yes    >500 ms      0.51              +--------------+---------+------+-----------+------------+--------+  GSV dist thigh          yes    >500 ms      .64               +--------------+---------+------+-----------+------------+--------+  GSV at knee             yes    >500 ms      0.44              +--------------+---------+------+-----------+------------+--------+  GSV prox calf           yes    >500 ms      0.39              +--------------+---------+------+-----------+------------+--------+  GSV mid calf            yes    >500 ms      0.40              +--------------+---------+------+-----------+------------+--------+  SSV Pop Fossa           yes    >500 ms      0.26              +--------------+---------+------+-----------+------------+--------+  SSV prox calf no                            0.21              +--------------+---------+------+-----------+------------+--------+  SSV mid calf  no                            0.26              +--------------+---------+------+-----------+------------+--------+  AASC O        no                            0.40              +--------------+---------+------+-----------+------------+--------+  AASV P        no  0.34              +--------------+---------+------+-----------+------------+--------+  AASV m        no                            0.24              +--------------+---------+------+-----------+------------+--------+         Summary:  Left:  - No evidence of deep vein thrombosis seen in the left lower extremity,  from the common femoral through the popliteal veins.  - No evidence of  superficial venous thrombosis in the left lower  extremity.  - Venous reflux is noted in the left common femoral vein.  - Venous reflux is noted in the left sapheno-femoral junction.  - Venous reflux is noted in the left greater saphenous vein in the thigh.  - Venous reflux is noted in the left greater saphenous vein in the calf.  - Venous reflux is noted in the left femoral vein.  - Venous reflux is noted in the left short saphenous vein.         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  We have discussed her options which would be continued compressive therapy versus primary sclerotherapy versus treatment of the underlying great saphenous vein reflux followed by sclerotherapy.  We will fit her for thigh-high compression stockings and have her follow-up in 3 months to discuss options again and plan moving forward.  We discussed the timing of compression stockings as well as expected procedural discussions in the future and she demonstrates good understanding.     Maeola Harman MD [facility]

## 2023-06-09 ENCOUNTER — Ambulatory Visit: Payer: BLUE CROSS/BLUE SHIELD | Admitting: Vascular Surgery

## 2023-06-16 ENCOUNTER — Ambulatory Visit (INDEPENDENT_AMBULATORY_CARE_PROVIDER_SITE_OTHER): Payer: Medicare Other | Admitting: Vascular Surgery

## 2023-06-16 ENCOUNTER — Encounter: Payer: Self-pay | Admitting: Vascular Surgery

## 2023-06-16 VITALS — BP 118/76 | HR 68 | Temp 98.0°F | Resp 20 | Ht 66.0 in | Wt 170.0 lb

## 2023-06-16 DIAGNOSIS — I872 Venous insufficiency (chronic) (peripheral): Secondary | ICD-10-CM | POA: Diagnosis not present

## 2023-06-16 NOTE — Progress Notes (Signed)
Patient ID: Colleen Kirby, female   DOB: January 19, 1956, 67 y.o.   MRN: [id number]  Reason for Consult: Follow-up   Referred by No ref. provider found  Subjective:     HPI:  Colleen Kirby is a 67 y.o. female here for 20-month follow-up of left lower extremity discomfort with symptomatic varicosities of her medial leg and also has multiple telangiectasias of the medial thigh as well as the leg and the ankle and foot.  No previous history of clotting or clotting disorders and has not had any venous intervention in the past.  She has heaviness of the left lower extremity associated with with swelling and has been compliant with thigh-high compression stockings since our initial evaluation 3 months ago.  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   Family history of breast cancer    Family history of colon cancer    Family history of testicular cancer    GERD (gastroesophageal reflux disease)    History of DVT (deep vein thrombosis)    Palpitations    Personal history of chemotherapy 2011   colon   Rectal cancer (HCC)    Family History  Problem Relation Age of Onset   Congestive Heart Failure Mother    Hypertension Mother    Breast cancer Mother 74       & again @ 33   Heart disease Father    Hypertension Brother    Healthy Brother    Pneumonia Maternal Uncle        HIV related   Colon cancer Paternal Aunt        died in her 46s   Heart disease Maternal Grandmother    Breast cancer Paternal Grandmother        dx in her 36s, died at 59   Heart disease Paternal Grandfather    Prostate cancer Cousin        paternal cousin with prostate cancer in his 70s   Testicular cancer Son 14   Coronary artery disease Other    Arrhythmia Other    Past Surgical History:  Procedure Laterality Date   SMALL INTESTINE SURGERY      Short Social History:  Social History   Tobacco  Use   Smoking status: Never   Smokeless tobacco: Never  Substance Use Topics   Alcohol use: No    Allergies  Allergen Reactions   Adhesive [Tape] Itching and Rash    Current Outpatient Medications  Medication Sig Dispense Refill   acetaminophen (TYLENOL) 500 MG tablet Take 500 mg by mouth every 6 (six) hours as needed for mild pain or headache.     aspirin EC 81 MG tablet Take 81 mg by mouth daily.     cholecalciferol (VITAMIN D) 1000 units tablet Take 2,000 Units by mouth daily.      docusate sodium (COLACE) 100 MG capsule Take 100 mg by mouth daily as needed for mild constipation.     estradiol (ESTRACE) 0.1 MG/GM vaginal cream Place 1 Applicatorful vaginally See admin instructions. Every 3 days     famotidine (PEPCID) 40 MG tablet Take 1 tablet by mouth as needed.     ibuprofen (ADVIL,MOTRIN) 200 MG tablet Take 200-400 mg by mouth every 6 (six) hours as needed. Pain     metoprolol succinate (TOPROL-XL) 25 MG 24 hr tablet TAKE 1/2 TABLET BY MOUTH EVERY DAY  45 tablet 3   Omega-3 Fatty Acids (OMEGA 3 PO) Take 1,250 mg by mouth daily.      Probiotic Product (ALIGN PO) Take 1 tablet by mouth every other day.      metoprolol tartrate (LOPRESSOR) 100 MG tablet Take 1 tablet (100 mg total) by mouth once for 1 dose. Take 1 tablet two hours prior to CT scan. 1 tablet 0   No current facility-administered medications for this visit.    Review of Systems  Constitutional:  Constitutional negative. HENT: HENT negative.  Eyes: Eyes negative.  Respiratory: Respiratory negative.  Cardiovascular: Positive for leg swelling.  GI: Gastrointestinal negative.  Musculoskeletal: Positive for leg pain.  Neurological: Neurological negative. Hematologic: Hematologic/lymphatic negative.  Psychiatric: Psychiatric negative.        Objective:  Objective   Vitals:   06/16/23 1442  BP: 118/76  Pulse: 68  Resp: 20  Temp: 98 F (36.7 C)  SpO2: 98%  Weight: 170 lb (77.1 kg)  Height: 5\' 6"  (1.676  m)   Body mass index is 27.44 kg/m.  Physical Exam HENT:     Head: Normocephalic.     Mouth/Throat:     Mouth: Mucous membranes are moist.  Eyes:     Pupils: Pupils are equal, round, and reactive to light.  Cardiovascular:     Rate and Rhythm: Normal rate.     Pulses: Normal pulses.  Pulmonary:     Effort: Pulmonary effort is normal.  Abdominal:     General: Abdomen is flat.  Musculoskeletal:     Cervical back: Normal range of motion.     Right lower leg: No edema.     Left lower leg: Edema present.  Skin:    General: Skin is warm.     Capillary Refill: Capillary refill takes less than 2 seconds.  Neurological:     General: No focal deficit present.     Mental Status: She is alert.  Psychiatric:        Mood and Affect: Mood normal.        Thought Content: Thought content normal.        Data: +--------------+---------+------+-----------+------------+--------+  LEFT         Reflux NoRefluxReflux TimeDiameter cmsComments                          Yes                                   +--------------+---------+------+-----------+------------+--------+  CFV                    yes   >1 second                       +--------------+---------+------+-----------+------------+--------+  FV mid                  yes   >1 second                       +--------------+---------+------+-----------+------------+--------+  Popliteal    no                                              +--------------+---------+------+-----------+------------+--------+  GSV at [facility]  yes    >500 ms      0.76              +--------------+---------+------+-----------+------------+--------+  GSV prox thigh          yes    >500 ms      0.68              +--------------+---------+------+-----------+------------+--------+  GSV mid thigh           yes    >500 ms      0.51               +--------------+---------+------+-----------+------------+--------+  GSV dist thigh          yes    >500 ms      .64               +--------------+---------+------+-----------+------------+--------+  GSV at knee             yes    >500 ms      0.44              +--------------+---------+------+-----------+------------+--------+  GSV prox calf           yes    >500 ms      0.39              +--------------+---------+------+-----------+------------+--------+  GSV mid calf            yes    >500 ms      0.40              +--------------+---------+------+-----------+------------+--------+  SSV Pop Fossa           yes    >500 ms      0.26              +--------------+---------+------+-----------+------------+--------+  SSV prox calf no                            0.21              +--------------+---------+------+-----------+------------+--------+  SSV mid calf  no                            0.26              +--------------+---------+------+-----------+------------+--------+  AASC O        no                            0.40              +--------------+---------+------+-----------+------------+--------+  AASV P        no                            0.34              +--------------+---------+------+-----------+------------+--------+  AASV m        no                            0.24              +--------------+---------+------+-----------+------------+--------+         Summary:  Left:  - No evidence of deep vein thrombosis seen in the left lower extremity,  from the common femoral through the popliteal veins.  - No  evidence of superficial venous thrombosis in the left lower  extremity.  - Venous reflux is noted in the left common femoral vein.  - Venous reflux is noted in the left sapheno-femoral junction.  - Venous reflux is noted in the left greater saphenous vein in the thigh.  - Venous reflux is noted in the left  greater saphenous vein in the calf.  - Venous reflux is noted in the left femoral vein.  - Venous reflux is noted in the left short saphenous vein.      Assessment/Plan:    66 year old female with C3 venous disease with symptomatic varicosities and multiple areas of telangiectasias and reticular veins.  We have discussed her options being sclerotherapy only versus taking care of the underlying problem and the larger varicosities of the medial leg.  Will plan for left greater saphenous vein ablation beginning above the knee and the left thigh area where the vein was evaluated at bedside today and noted to be within the fascia.  She will also need stab phlebectomy less than 10 and 3 units of sclerotherapy to follow.  We discussed the risk benefits and alternatives and she demonstrates good understanding and will continue compression stockings.  We also discussed the expected recovery.      Maeola Harman MD [facility]

## 2023-06-24 ENCOUNTER — Other Ambulatory Visit: Payer: Self-pay | Admitting: *Deleted

## 2023-06-24 DIAGNOSIS — I83812 Varicose veins of left lower extremities with pain: Secondary | ICD-10-CM

## 2023-06-24 DIAGNOSIS — I83811 Varicose veins of right lower extremities with pain: Secondary | ICD-10-CM

## 2023-08-27 ENCOUNTER — Other Ambulatory Visit: Payer: Self-pay | Admitting: Family Medicine

## 2023-08-27 DIAGNOSIS — K6289 Other specified diseases of anus and rectum: Secondary | ICD-10-CM

## 2023-08-27 DIAGNOSIS — Z85048 Personal history of other malignant neoplasm of rectum, rectosigmoid junction, and anus: Secondary | ICD-10-CM

## 2023-08-30 ENCOUNTER — Ambulatory Visit
Admission: RE | Admit: 2023-08-30 | Discharge: 2023-08-30 | Discharge status: Home / Self Care | Payer: Medicare Other | Source: Ambulatory Visit | Attending: Family Medicine | Admitting: Family Medicine

## 2023-08-30 DIAGNOSIS — Z85048 Personal history of other malignant neoplasm of rectum, rectosigmoid junction, and anus: Secondary | ICD-10-CM

## 2023-08-30 DIAGNOSIS — K6289 Other specified diseases of anus and rectum: Secondary | ICD-10-CM

## 2023-08-30 MED ORDER — IOPAMIDOL (ISOVUE-300) INJECTION 61%
100.0000 mL | Freq: Once | INTRAVENOUS | Status: AC | PRN
  Administered 2023-08-30: 100 mL via INTRAVENOUS

## 2023-08-31 ENCOUNTER — Other Ambulatory Visit: Payer: Self-pay | Admitting: *Deleted

## 2023-08-31 MED ORDER — LORAZEPAM 1 MG PO TABS: ORAL_TABLET | ORAL | 0 refills | Status: DC

## 2023-09-09 ENCOUNTER — Ambulatory Visit (INDEPENDENT_AMBULATORY_CARE_PROVIDER_SITE_OTHER): Payer: Medicare Other | Admitting: Vascular Surgery

## 2023-09-09 ENCOUNTER — Encounter: Payer: Self-pay | Admitting: Vascular Surgery

## 2023-09-09 VITALS — BP 126/80 | HR 80 | Temp 98.1°F | Resp 16 | Ht 65.5 in | Wt 164.0 lb

## 2023-09-09 DIAGNOSIS — I83812 Varicose veins of left lower extremities with pain: Secondary | ICD-10-CM | POA: Diagnosis not present

## 2023-09-09 HISTORY — PX: ENDOVENOUS ABLATION SAPHENOUS VEIN W/ LASER: SUR449

## 2023-09-09 NOTE — Progress Notes (Addendum)
     Laser Ablation Procedure    Date: 09/09/2023   Colleen Kirby DOB:08-13-1956  Consent signed: Yes     Surgeon: Dr. Apolinar Junes cain Procedure: Laser Ablation:  LEFT Greater Saphenous Vein  BP 126/80 (BP Location: Left Arm, Patient Position: Sitting, Cuff Size: Normal)   Pulse 80   Temp 98.1 F (36.7 C) (Temporal)   Resp 16   Ht 5' 5.5" (1.664 m)   Wt 164 lb (74.4 kg)   SpO2 98%   BMI 26.88 kg/m   Tumescent Anesthesia: 400 cc 0.9% NaCl with 50 cc Lidocaine HCL 1%  and 15 cc 8.4% NaHCO3 Local Anesthesia: 15 cc Lidocaine HCL and NaHCO3 (ratio 2:1)  7 watts continuous mode Total energy: 1057.5 joules    Total time: 151 seconds Treatment Length 21 cm  Laser Fiber Ref. #  [id number]              Lot # [id number] Stab Phlebectomy: <10 Sites: left calf  Patient tolerated procedure well Notes:  All staff members wore facial masks. Pt had 1 mg (1 tab) of ativan at 9:00 am prior to surgical procedure.  Description of Procedure: After marking the course of the secondary varicosities, the patient was placed on the operating table in the supine position, and the left leg was prepped and draped in sterile fashion.   Local anesthetic was administered and under ultrasound guidance the saphenous vein was accessed with a micro needle and guide wire; then the mirco puncture sheath was placed.  A guide wire was inserted saphenofemoral junction , followed by a 5 french sheath.  The position of the sheath and then the laser fiber below the junction was confirmed using the ultrasound.  Tumescent anesthesia was administered along the course of the saphenous vein using ultrasound guidance. The patient was placed in Trendelenburg position and protective laser glasses were placed on patient and staff, and the laser was fired at 7 watts continuous mode for a total of 1057.5 joules.  For stab phlebectomies, local anesthetic was administered at the previously marked varicosities, and tumescent anesthesia was  administered around the vessels.  Less than 10 stab wounds were made using the tip of an 11 blade. And using the vein hook, the phlebectomies were performed using a hemostat to avulse the varicosities.  Adequate hemostasis was achieved.   Steri strips were applied to the stab wounds and ABD pads and thigh high compression stockings were applied.  Ace wrap bandages were applied over the phlebectomy sites and at the top of the saphenofemoral junction. Blood loss was less than 15 cc.  Discharge instructions reviewed with patient and hardcopy of discharge instructions given to patient to take home. The patient ambulated out of the operating room having tolerated the procedure well.

## 2023-09-09 NOTE — Progress Notes (Signed)
Patient name: Colleen Kirby MRN: [id number] DOB: 1956-02-14 Sex: female  REASON FOR VISIT: Treatment of left lower extremity painful varicosities  HPI: Colleen Kirby is a 68 y.o. female with history of C3 venous disease and symptomatic varicosities and multiple areas of telangiectasias and reticular veins of the left lower extremity.  She has been compliant with thigh-high compression stockings.  She is here today for left greater saphenous vein ablation and stab phlebectomy less than 10 and she will also require sclerotherapy.  Current Outpatient Medications  Medication Sig Dispense Refill   acetaminophen (TYLENOL) 500 MG tablet Take 500 mg by mouth every 6 (six) hours as needed for mild pain or headache.     aspirin EC 81 MG tablet Take 81 mg by mouth daily.     cholecalciferol (VITAMIN D) 1000 units tablet Take 2,000 Units by mouth daily.      docusate sodium (COLACE) 100 MG capsule Take 100 mg by mouth daily as needed for mild constipation.     estradiol (ESTRACE) 0.1 MG/GM vaginal cream Place 1 Applicatorful vaginally See admin instructions. Every 3 days     famotidine (PEPCID) 40 MG tablet Take 1 tablet by mouth as needed.     ibuprofen (ADVIL,MOTRIN) 200 MG tablet Take 200-400 mg by mouth every 6 (six) hours as needed. Pain     LORazepam (ATIVAN) 1 MG tablet Take 1 tablet 30 to 60 minutes prior to leaving house on day of office surgery.  Bring second tablet with you to office on day of office surgery. 2 tablet 0   metoprolol succinate (TOPROL-XL) 25 MG 24 hr tablet TAKE 1/2 TABLET BY MOUTH EVERY DAY 45 tablet 3   metoprolol tartrate (LOPRESSOR) 100 MG tablet Take 1 tablet (100 mg total) by mouth once for 1 dose. Take 1 tablet two hours prior to CT scan. 1 tablet 0   Omega-3 Fatty Acids (OMEGA 3 PO) Take 1,250 mg by mouth daily.      Probiotic Product (ALIGN PO) Take 1 tablet by mouth every other day.      No current facility-administered medications for this visit.     PHYSICAL EXAM: Vitals:   09/09/23 1051  BP: 126/80  Pulse: 80  Resp: 16  Temp: 98.1 F (36.7 C)  SpO2: 98%     PROCEDURE: Left greater saphenous vein ablation totaling 21cm and stab phlebectomy less than 10 of the left medial and lateral leg.  TECHNIQUE: Colleen Kirby was taken to the procedure room where she was initially placed standing and the varicosities of the left lower extremity were marked on the skin.  She was then laid supine and sterilely prepped and draped in the usual fashion of the left lower extremity and timeout called.  We began using ultrasound to identify the great saphenous vein just above the knee and this was cannulated with a micropuncture needle followed by wire and a sheath.  Ultrasound was used to identify a J-wire to a few centimeters proximal to the saphenofemoral junction and the introducer sheath was then placed and laser to a total of 21 cm.  Tumescent anesthesia was instilled along the laser catheter and the vein was then ablated for the entirety of the great saphenous vein up to a few centimeters from the femoral junction.  At completion the left common femoral vein was noted to be patent and compressible by ultrasound.  Attention was then turned to the varicosities of the medial and lateral lower leg and  these areas were anesthetized 1% lidocaine and stab incisions were created and the veins were grasped with crochet hook and removed with hemostat.  A total of 7 incisions were made.  Steri-Strips were placed and a sterile compressive wrap was placed.  She tolerated the procedure well without any complication.  Plan will be to follow-up in 2 weeks with post ablation duplex and she will be considered for sclerotherapy of the left lower extremity.  Colleen Kirby [facility] [place] [phone]

## 2023-09-14 ENCOUNTER — Encounter: Payer: Self-pay | Admitting: Internal Medicine

## 2023-09-23 ENCOUNTER — Ambulatory Visit (INDEPENDENT_AMBULATORY_CARE_PROVIDER_SITE_OTHER): Payer: Medicare Other | Admitting: Vascular Surgery

## 2023-09-23 ENCOUNTER — Ambulatory Visit (INDEPENDENT_AMBULATORY_CARE_PROVIDER_SITE_OTHER)
Admission: RE | Admit: 2023-09-23 | Discharge: 2023-09-23 | Disposition: A | Discharge status: Home / Self Care | Payer: Medicare Other | Source: Ambulatory Visit | Attending: Vascular Surgery | Admitting: Vascular Surgery

## 2023-09-23 ENCOUNTER — Encounter: Payer: Self-pay | Admitting: Vascular Surgery

## 2023-09-23 ENCOUNTER — Ambulatory Visit (HOSPITAL_COMMUNITY)
Admission: RE | Admit: 2023-09-23 | Discharge: 2023-09-23 | Disposition: A | Discharge status: Home / Self Care | Payer: Medicare Other | Source: Ambulatory Visit | Attending: Vascular Surgery | Admitting: Vascular Surgery

## 2023-09-23 VITALS — BP 117/73 | HR 72 | Temp 98.2°F | Resp 20 | Ht 65.5 in | Wt 170.8 lb

## 2023-09-23 DIAGNOSIS — I83811 Varicose veins of right lower extremities with pain: Secondary | ICD-10-CM | POA: Diagnosis present

## 2023-09-23 DIAGNOSIS — I83812 Varicose veins of left lower extremities with pain: Secondary | ICD-10-CM | POA: Diagnosis present

## 2023-09-23 NOTE — Progress Notes (Signed)
 Patient ID: Colleen Kirby, female   DOB: 08-Aug-1956, 68 y.o.   MRN: [id number]  Reason for Consult: Routine Post Op (Laser ablation Left GSV)   Referred by No ref. provider found  Subjective:     HPI:  Colleen Kirby is a 68 y.o. female underwent laser ablation of her left greater saphenous vein and stab phlebectomy of few varicosities for symptomatic varicosities with C3 venous disease.  She continues to wear thigh-high compression stockings.  She does have some pain particularly at the sites of stab phlebectomy but otherwise is recovering well.  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   Family history of breast cancer    Family history of colon cancer    Family history of testicular cancer    GERD (gastroesophageal reflux disease)    History of DVT (deep vein thrombosis)    Palpitations    Peripheral vascular disease (HCC)    Personal history of chemotherapy 2011   colon   Rectal cancer (HCC)    Family History  Problem Relation Age of Onset   Congestive Heart Failure Mother    Hypertension Mother    Breast cancer Mother 30       & again @ 69   Heart disease Father    Hypertension Brother    Healthy Brother    Pneumonia Maternal Uncle        HIV related   Colon cancer Paternal Aunt        died in her 25s   Heart disease Maternal Grandmother    Breast cancer Paternal Grandmother        dx in her 66s, died at 73   Heart disease Paternal Grandfather    Prostate cancer Cousin        paternal cousin with prostate cancer in his 17s   Testicular cancer Son 30   Coronary artery disease Other    Arrhythmia Other    Past Surgical History:  Procedure Laterality Date   ENDOVENOUS ABLATION SAPHENOUS VEIN W/ LASER Left 09/09/2023   endovenous laser ablation left greater saphenous vein and stab phlebectomy< 10 incisions left leg by Penne Colorado MD   SMALL  INTESTINE SURGERY      Short Social History:  Social History   Tobacco Use   Smoking status: Never   Smokeless tobacco: Never  Substance Use Topics   Alcohol use: No    Allergies  Allergen Reactions   Adhesive [Tape] Itching and Rash    Current Outpatient Medications  Medication Sig Dispense Refill   acetaminophen (TYLENOL) 500 MG tablet Take 500 mg by mouth every 6 (six) hours as needed for mild pain or headache.     aspirin  EC 81 MG tablet Take 81 mg by mouth daily.     cholecalciferol (VITAMIN D) 1000 units tablet Take 2,000 Units by mouth daily.      docusate sodium (COLACE) 100 MG capsule Take 100 mg by mouth daily as needed for mild constipation.     estradiol (ESTRACE) 0.1 MG/GM vaginal cream Place 1 Applicatorful vaginally See admin instructions. Every 3 days     famotidine  (PEPCID ) 40 MG tablet Take 1 tablet by mouth as needed.     ibuprofen (ADVIL,MOTRIN) 200 MG tablet Take 200-400 mg by mouth every 6 (six) hours as needed. Pain     metoprolol  succinate (TOPROL -XL) 25 MG 24  hr tablet TAKE 1/2 TABLET BY MOUTH EVERY DAY 45 tablet 3   Omega-3 Fatty Acids (OMEGA 3 PO) Take 1,250 mg by mouth daily.      Probiotic Product (ALIGN PO) Take 1 tablet by mouth every other day.      metoprolol  tartrate (LOPRESSOR ) 100 MG tablet Take 1 tablet (100 mg total) by mouth once for 1 dose. Take 1 tablet two hours prior to CT scan. 1 tablet 0   No current facility-administered medications for this visit.    Review of Systems  Constitutional:  Constitutional negative. HENT: HENT negative.  Eyes: Eyes negative.  Cardiovascular: Cardiovascular negative.  GI: Gastrointestinal negative.  Musculoskeletal: Positive for leg pain.  Neurological: Neurological negative. Hematologic: Hematologic/lymphatic negative.  Psychiatric: Psychiatric negative.        Objective:  Objective   Vitals:   09/23/23 1011  BP: 117/73  Pulse: 72  Resp: 20  Temp: 98.2 F (36.8 C)  TempSrc: Temporal   SpO2: 99%  Weight: 170 lb 12.8 oz (77.5 kg)  Height: 5' 5.5 (1.664 m)   Body mass index is 27.99 kg/m.  Physical Exam HENT:     Head: Normocephalic.     Nose: Nose normal.  Eyes:     Pupils: Pupils are equal, round, and reactive to light.  Cardiovascular:     Rate and Rhythm: Normal rate.  Pulmonary:     Effort: Pulmonary effort is normal.  Abdominal:     General: Abdomen is flat.  Musculoskeletal:        General: Normal range of motion.     Cervical back: Normal range of motion.     Right lower leg: No edema.     Left lower leg: No edema.     Comments: Telangiectasias throughout the bilateral extremities  Skin:    General: Skin is warm.     Capillary Refill: Capillary refill takes less than 2 seconds.  Neurological:     General: No focal deficit present.     Mental Status: She is alert.  Psychiatric:        Mood and Affect: Mood normal.     Data: +---------+---------+------+-----------+------------+--------+  LEFT    Reflux NoRefluxReflux TimeDiameter cmsComments                     Yes                                   +---------+---------+------+-----------+------------+--------+  CFV                                           patent    +---------+---------+------+-----------+------------+--------+  FV prox                                        patent    +---------+---------+------+-----------+------------+--------+  FV mid                                         patent    +---------+---------+------+-----------+------------+--------+  FV dist  patent    +---------+---------+------+-----------+------------+--------+  Popliteal                                     patent    +---------+---------+------+-----------+------------+--------+         Summary:  Left:  - No evidence of deep vein thrombosis seen in the left lower extremity,  from the common femoral through the  popliteal veins.  - Successful ablation of the GSV from the knee to within 3.82 of the  saphenofemoral junction.   Venous Reflux Times  +--------------+---------+------+-----------+------------+-------------+  RIGHT        Reflux NoRefluxReflux TimeDiameter cmsComments                               Yes                                        +--------------+---------+------+-----------+------------+-------------+  CFV          no                                                   +--------------+---------+------+-----------+------------+-------------+  FV mid        no                                                   +--------------+---------+------+-----------+------------+-------------+  Popliteal    no                                                   +--------------+---------+------+-----------+------------+-------------+  GSV at SFJ              yes    >500 ms      .758                   +--------------+---------+------+-----------+------------+-------------+  GSV prox thigh          yes    >500 ms      .362                   +--------------+---------+------+-----------+------------+-------------+  GSV mid thigh           yes    >500 ms      .313    out of fascia  +--------------+---------+------+-----------+------------+-------------+  GSV dist thigh          yes    >500 ms      .329    out of fascia  +--------------+---------+------+-----------+------------+-------------+  GSV at knee             yes    >500 ms      .305    out of fascia  +--------------+---------+------+-----------+------------+-------------+  GSV prox calf no                            .  127                   +--------------+---------+------+-----------+------------+-------------+  GSV mid calf  no                            .154                   +--------------+---------+------+-----------+------------+-------------+  SSV Pop  Fossa no                            .290                   +--------------+---------+------+-----------+------------+-------------+  SSV prox calf no                            .239                   +--------------+---------+------+-----------+------------+-------------+         Summary:  Right:  - No evidence of deep vein thrombosis seen in the right lower extremity,  from the common femoral through the popliteal veins.  - No evidence of superficial venous thrombosis in the right lower  extremity.  - Venous reflux is noted in the right sapheno-femoral junction.  - Venous reflux is noted in the right greater saphenous vein in the thigh.         Assessment/Plan:    68 year old female status post left greater saphenous vein ablation and stab phlebectomy less than 10 for symptomatic varicosities.  She does have telangiectasias of the left lower extremity and also has some on the right side.  We will consider her for sclerotherapy when she has resolved the leg pain and bruising.  She is to call Inocente Row, RN to arrange sclerotherapy and she was provided her card today and she can see me on an as-needed basis.     Penne Lonni Colorado MD [facility]

## 2023-11-21 ENCOUNTER — Other Ambulatory Visit: Payer: Self-pay | Admitting: Cardiology

## 2023-11-25 ENCOUNTER — Encounter: Payer: Self-pay | Admitting: Internal Medicine

## 2023-11-25 ENCOUNTER — Ambulatory Visit: Payer: BLUE CROSS/BLUE SHIELD | Admitting: Internal Medicine

## 2023-11-25 VITALS — BP 110/64 | HR 86 | Ht 65.5 in | Wt 170.5 lb

## 2023-11-25 DIAGNOSIS — K435 Parastomal hernia without obstruction or  gangrene: Secondary | ICD-10-CM | POA: Diagnosis not present

## 2023-11-25 DIAGNOSIS — Z933 Colostomy status: Secondary | ICD-10-CM | POA: Diagnosis not present

## 2023-11-25 DIAGNOSIS — K219 Gastro-esophageal reflux disease without esophagitis: Secondary | ICD-10-CM

## 2023-11-25 DIAGNOSIS — Z85048 Personal history of other malignant neoplasm of rectum, rectosigmoid junction, and anus: Secondary | ICD-10-CM | POA: Diagnosis not present

## 2023-11-25 MED ORDER — NA SULFATE-K SULFATE-MG SULF 17.5-3.13-1.6 GM/177ML PO SOLN: 1.0000 | Freq: Once | ORAL | 0 refills | Status: AC

## 2023-11-25 MED ORDER — FAMOTIDINE 20 MG PO TABS: 20.0000 mg | ORAL_TABLET | Freq: Two times a day (BID) | ORAL | 11 refills | Status: AC | PRN

## 2023-11-25 NOTE — Addendum Note (Signed)
 Addended by: Nadyne Coombes B on: 11/25/2023 11:47 AM   Modules accepted: Orders

## 2023-11-25 NOTE — Patient Instructions (Addendum)
 We sent a referral to Dr Drue Dun at [facility].   We have sent the following medications to your pharmacy for you to pick up at your convenience: famotidine and suprep  You have been scheduled for a colonoscopy. Please follow written instructions given to you at your visit today.   If you use inhalers (even only as needed), please bring them with you on the day of your procedure.  DO NOT TAKE 7 DAYS PRIOR TO TEST- Trulicity (dulaglutide) Ozempic, Wegovy (semaglutide) Mounjaro (tirzepatide) Bydureon Bcise (exanatide extended release)  DO NOT TAKE 1 DAY PRIOR TO YOUR TEST Rybelsus (semaglutide) Adlyxin (lixisenatide) Victoza (liraglutide) Byetta (exanatide) ___________________________________________________________________________  _______________________________________________________  If your blood pressure at your visit was 140/90 or greater, please contact your primary care physician to follow up on this.  _______________________________________________________  If you are age 69 or older, your body mass index should be between 23-30. Your Body mass index is 27.94 kg/m. If this is out of the aforementioned range listed, please consider follow up with your Primary Care Provider.  If you are age 82 or younger, your body mass index should be between 19-25. Your Body mass index is 27.94 kg/m. If this is out of the aformentioned range listed, please consider follow up with your Primary Care Provider.   ________________________________________________________  The [facility] providers would like to encourage you to use [facility] to communicate with providers for non-urgent requests or questions.  Due to long hold times on the telephone, sending your provider a message by [facility] may be a faster and more efficient way to get a response.  Please allow 48 business hours for a response.  Please remember that this is for non-urgent requests.   _______________________________________________________

## 2023-11-25 NOTE — Progress Notes (Signed)
 Patient ID: Colleen Kirby, female   DOB: Jul 30, 1956, 68 y.o.   MRN: [id number] HPI: Colleen Kirby is a 68 year old female with rectal cancer status post chemotherapy/XRT and resection with end colostomy (2011) and GERD who presents to establish care. She was referred by her previous gastroenterologist, Dr. Kinnie Scales, who retired.  She has a history of rectal cancer diagnosed in May 2011, treated with surgery in September 2011, along with chemotherapy and radiation. She has had an ostomy since her surgery. Her last endoscopic evaluation was in 2022, which included an upper endoscopy that was normal without Barrett's esophagus, and a colonoscopy via stoma that was normal but noted a mild peristomal hernia. The scope could not be completely advanced into the cecum. A repeat colonoscopy was recommended in three years.  A CT scan in January 2025 for lower pelvic pain showed a large peristomal hernia containing fat and small bowel without obstruction, and cholelithiasis. There was mild chronic presacral soft tissue stranding without evidence of a mass. She experienced pelvic or rectal type pain early in the year, described as 'strange' and not terribly painful, which has since faded. She has not noticed it recently. She has a peristomal hernia that does not cause pain but is noticeable due to its size. She recalls an episode years ago that felt like labor pain, which resolved on its own after a visit to the ER. She has not experienced similar symptoms since then.  She has a history of GERD, previously managed with pantoprazole. Currently, she is not experiencing symptoms and uses famotidine as needed, though she has not taken it in a while due to lack of symptoms. She inquires about the timing of famotidine intake, noting she takes it on demand.  No current gastrointestinal symptoms, including heartburn, indigestion, trouble swallowing, nausea, vomiting, or abdominal pain. The peristomal hernia does not cause  pain.   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   Family history of breast cancer    Family history of colon cancer    Family history of testicular cancer    GERD (gastroesophageal reflux disease)    History of DVT (deep vein thrombosis)    Palpitations    Peripheral vascular disease (HCC)    Personal history of chemotherapy 2011   colon   Rectal cancer [facility])     Past Surgical History:  Procedure Laterality Date   ENDOVENOUS ABLATION SAPHENOUS VEIN W/ LASER Left 09/09/2023   endovenous laser ablation left greater saphenous vein and stab phlebectomy< 10 incisions left leg by Lemar Livings MD   SMALL INTESTINE SURGERY      Outpatient Medications Prior to Visit  Medication Sig Dispense Refill   acetaminophen (TYLENOL) 500 MG tablet Take 500 mg by mouth every 6 (six) hours as needed for mild pain or headache.     aspirin EC 81 MG tablet Take 81 mg by mouth daily.     cholecalciferol (VITAMIN D) 1000 units tablet Take 2,000 Units by mouth daily.      docusate sodium (COLACE) 100 MG capsule Take 100 mg by mouth daily as needed for mild constipation.     estradiol (ESTRACE) 0.1 MG/GM vaginal cream Place 1 Applicatorful vaginally See admin instructions. Every 3 days     ibuprofen (ADVIL,MOTRIN) 200 MG tablet Take 200-400 mg by mouth every 6 (six) hours as needed. Pain  metoprolol succinate (TOPROL-XL) 25 MG 24 hr tablet TAKE 1/2 TABLET BY MOUTH DAILY 45 tablet 0   Omega-3 Fatty Acids (OMEGA 3 PO) Take 1,250 mg by mouth daily.      Probiotic Product (ALIGN PO) Take 1 tablet by mouth every other day.      famotidine (PEPCID) 40 MG tablet Take 1 tablet by mouth as needed.     metoprolol tartrate (LOPRESSOR) 100 MG tablet Take 1 tablet (100 mg total) by mouth once for 1 dose. Take 1 tablet two hours prior to CT scan. 1 tablet 0   No facility-administered medications prior to visit.     Allergies  Allergen Reactions   Adhesive [Tape] Itching and Rash    Family History  Problem Relation Age of Onset   Congestive Heart Failure Mother    Hypertension Mother    Breast cancer Mother 63       & again @ 56   Heart failure Mother    Atrial fibrillation Mother    Heart disease Father    Hypertension Brother    Healthy Brother    Pneumonia Maternal Uncle        HIV related   Colon cancer Paternal Aunt        died in her 57s   Heart disease Maternal Grandmother    Breast cancer Paternal Grandmother        dx in her 28s, died at 18   Heart disease Paternal Grandfather    Testicular cancer Son 69   Prostate cancer Cousin        paternal cousin with prostate cancer in his 60s   Coronary artery disease Other    Arrhythmia Other    Esophageal cancer Neg Hx    Pancreatic cancer Neg Hx    Stomach cancer Neg Hx     Social History   Tobacco Use   Smoking status: Never   Smokeless tobacco: Never  Vaping Use   Vaping status: Never Used  Substance Use Topics   Alcohol use: No   Drug use: Never    ROS: As per history of present illness, otherwise negative  BP 110/64   Pulse 86   Ht 5' 5.5" (1.664 m)   Wt 170 lb 8 oz (77.3 kg)   SpO2 100%   BMI 27.94 kg/m  Gen: awake, alert, NAD HEENT: anicteric  Abd: soft, NT/ND, +BS throughout, parastomal hernia most notable lateral to her left lower quadrant sigmoid colostomy Ext: no c/c/e Neuro: nonfocal   RELEVANT LABS AND IMAGING: RADIOLOGY CT scan with contrast: Status post rectal resection with left abdominal colostomy; large peristomal hernia containing fat and small bowel without obstruction; cholelithiasis; mild chronic presacral soft tissue stranding without evidence for a mass; no free air or pelvic effusion (08/30/2023)  DIAGNOSTIC Upper endoscopy: Normal without Barrett's esophagus (04/04/2021) Colonoscopy via stoma: Normal; scope could not be completely advanced into the cecum  (04/04/2021)  ASSESSMENT/PLAN: Rectal cancer (2011) status post resection with colostomy Rectal cancer resected in 2011 with colostomy. Last colonoscopy in 2022 incomplete due to peristomal hernia. No recurrence or complications. Complete colonoscopy needed for full visualization. 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 Chronic, asymptomatic currently but risk of obstruction. Surgical evaluation considered due to size and potential complications.  - Refer to Dr. Raiford Simmonds for surgical evaluation. - Discuss surgical repair option with Dr. Drue Dun.  Gastroesophageal reflux disease (GERD)  GERD previously managed with pantoprazole, currently asymptomatic. Uses famotidine as needed, effective for 12 hours. Recommended for nocturnal relief with evening dosing. - Continue famotidine 20 mg twice daily as needed. - Educate on evening dosing for nocturnal relief.  Follow-up Establishing care with new gastroenterologist. Coordination with primary care and obtaining previous labs necessary. Surgical opinion needed before colonoscopy. - Obtain basic lab work from Dr. Kateri Plummer at [facility]. - Schedule follow-up after colonoscopy and surgical consultation.   OZ:[id number], Clifton Custard, Md [address] [place],  [place] [address]

## 2023-12-22 ENCOUNTER — Ambulatory Visit: Attending: Vascular Surgery

## 2024-01-11 ENCOUNTER — Telehealth: Payer: Self-pay | Admitting: Internal Medicine

## 2024-01-11 NOTE — Telephone Encounter (Signed)
 Pt state hernia repair tentatively 7/17 scheduled in [facility] Dr. Ara Knee [facility]. Pt states you wanted to know the date to determine on whether you will proceed w/procedure on 6/3. Please advise. Thanks, [employer] LPN

## 2024-01-11 NOTE — Telephone Encounter (Signed)
 Inbound call fro patient states is scheduled for hernia repair July 17th.   Patient has procedure 6/3.   Requesting f/u call to discuss. Pleas advise. Thank you

## 2024-01-11 NOTE — Telephone Encounter (Signed)
 Left vm

## 2024-01-12 NOTE — Telephone Encounter (Signed)
 Left message for patient to call back. Per Dr Bridgett Camps, he would like to hold off on colonoscopy until about 1 month after her parastomal hernia surgery unless Dr Ara Knee, her surgeon is specifically requesting colonoscopy to be done prior to the hernia repair. Waiting until after surgery will decrease the risk of incomplete colonoscopy due to occlusion from the  hernia.

## 2024-01-12 NOTE — Telephone Encounter (Signed)
 Colonoscopy remains recommended but it would be reasonable to delay this until she is healed from the parastomal hernia repair which is scheduled in mid July. She will likely have a 1 month postoperative follow-up with her surgeon and if cleared thereafter we can proceed with colonoscopy for surveillance given history of rectal cancer  Please let me know if you or she has any questions regarding this recommendation

## 2024-01-12 NOTE — Telephone Encounter (Signed)
 Patient is advised of Dr Marietta Shorter recommendation to hold off on colonoscopy until after parastomal hernia repair. Patient has rescheduled colonoscopy to 04/13/24 at this time and understands Dr Ara Knee will also have to clear her from surgical standpoint before moving forward with 8/28 colonoscopy.

## 2024-01-18 ENCOUNTER — Encounter: Admitting: Internal Medicine

## 2024-02-17 ENCOUNTER — Other Ambulatory Visit: Payer: Self-pay | Admitting: Cardiology

## 2024-02-29 ENCOUNTER — Ambulatory Visit: Attending: Cardiology | Admitting: Cardiology

## 2024-02-29 ENCOUNTER — Encounter: Payer: Self-pay | Admitting: Cardiology

## 2024-02-29 VITALS — BP 122/64 | HR 73 | Ht 65.5 in | Wt 162.2 lb

## 2024-02-29 DIAGNOSIS — I700 Atherosclerosis of aorta: Secondary | ICD-10-CM | POA: Diagnosis not present

## 2024-02-29 DIAGNOSIS — I471 Supraventricular tachycardia, unspecified: Secondary | ICD-10-CM | POA: Diagnosis not present

## 2024-02-29 DIAGNOSIS — R600 Localized edema: Secondary | ICD-10-CM | POA: Diagnosis not present

## 2024-02-29 DIAGNOSIS — I872 Venous insufficiency (chronic) (peripheral): Secondary | ICD-10-CM | POA: Diagnosis not present

## 2024-02-29 MED ORDER — METOPROLOL SUCCINATE ER 25 MG PO TB24: 12.5000 mg | ORAL_TABLET | Freq: Every day | ORAL | 0 refills | Status: DC

## 2024-02-29 NOTE — Progress Notes (Signed)
 Date:  02/29/2024   ID:  Colleen Kirby, DOB 02/28/1956, MRN [id number]   PCP:  Kip Righter, MD  Cardiologist:  Wilbert Bihari, MD Electrophysiologist:  None   Chief Complaint:  Palpitations and PSVT  History of Present Illness:    Colleen Kirby is a 68 y.o. female with history of PSVT and chronic LE edema felt secondary to chronic venous insufficiency.  2D echo in the past was normal.  Her PSVT has been controlled on BB.  When she was last seen by Dr. Mady she complained of snoring, fatigue and PND and she underwent home sleep study in Jan 2020 showing no OSA (AHI 0.8/hr). She had a coronary CTA with no coronary Ca and no CAD.  She is here for follow-up today.  She denies any chest pain or pressure, shortness of breath, dyspnea on exertion, PND, orthopnea, dizziness or syncope. Occasionally she will have some very brief skipped heart beats.  She rarely has some mild LE edema.   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   Family history of breast cancer    Family history of colon cancer    Family history of testicular cancer    GERD (gastroesophageal reflux disease)    History of DVT (deep vein thrombosis)    Palpitations    Peripheral vascular disease (HCC)    Personal history of chemotherapy 2011   colon   Rectal cancer [facility])    Past Surgical History:  Procedure Laterality Date   ENDOVENOUS ABLATION SAPHENOUS VEIN W/ LASER Left 09/09/2023   endovenous laser ablation left greater saphenous vein and stab phlebectomy< 10 incisions left leg by Penne Colorado MD   SMALL INTESTINE SURGERY       Current Meds  Medication Sig   acetaminophen (TYLENOL) 500 MG tablet Take 500 mg by mouth every 6 (six) hours as needed for mild pain or headache.   aspirin  EC 81 MG tablet Take 81 mg by mouth daily.    cholecalciferol (VITAMIN D) 1000 units tablet Take 2,000 Units by mouth daily.    docusate sodium (COLACE) 100 MG capsule Take 100 mg by mouth daily as needed for mild constipation.   estradiol (ESTRACE) 0.1 MG/GM vaginal cream Place 1 Applicatorful vaginally See admin instructions. Every 3 days   famotidine  (PEPCID ) 20 MG tablet Take 1 tablet (20 mg total) by mouth 2 (two) times daily as needed for heartburn or indigestion.   ibuprofen (ADVIL,MOTRIN) 200 MG tablet Take 200-400 mg by mouth every 6 (six) hours as needed. Pain   metoprolol  succinate (TOPROL -XL) 25 MG 24 hr tablet Take 0.5 tablets (12.5 mg total) by mouth daily. Must keep scheduled appointment for future refills. Thank you.   Omega-3 Fatty Acids (OMEGA 3 PO) Take 1,250 mg by mouth daily.    Probiotic Product (ALIGN PO) Take 1 tablet by mouth every other day.      Allergies:   Adhesive [tape]   Social History   Tobacco Use   Smoking status: Never   Smokeless tobacco: Never  Vaping Use   Vaping status: Never Used  Substance Use Topics   Alcohol use: No   Drug use: Never     Family Hx: The patient's family history includes Arrhythmia in an other family member; Atrial fibrillation in her mother; Breast  cancer in her paternal grandmother; Breast cancer (age of onset: 24) in her mother; Colon cancer in her paternal aunt; Congestive Heart Failure in her mother; Coronary artery disease in an other family member; Healthy in her brother; Heart disease in her father, maternal grandmother, and paternal grandfather; Heart failure in her mother; Hypertension in her brother and mother; Pneumonia in her maternal uncle; Prostate cancer in her cousin; Testicular cancer (age of onset: 73) in her son. There is no history of Esophageal cancer, Pancreatic cancer, or Stomach cancer.  ROS:   Please see the history of present illness.     All other systems reviewed and are negative.   Labs/Other Tests and Data Reviewed:    Recent Labs: No  results found for requested labs within last 365 days.   Recent Lipid Panel No results found for: CHOL, TRIG, HDL, CHOLHDL, LDLCALC, LDLDIRECT  Wt Readings from Last 3 Encounters:  02/29/24 162 lb 3.2 oz (73.6 kg)  11/25/23 170 lb 8 oz (77.3 kg)  09/23/23 170 lb 12.8 oz (77.5 kg)    EKG Interpretation Date/Time:  Tuesday February 29 2024 09:32:12 EDT Ventricular Rate:  73 PR Interval:  192 QRS Duration:  68 QT Interval:  374 QTC Calculation: 412 R Axis:   17  Text Interpretation: Normal sinus rhythm Normal ECG Confirmed by Shlomo Corning ([id number]) on 02/29/2024 9:43:01 AM    Objective:    Vital Signs:  BP 122/64   Pulse 73   Ht 5' 5.5 (1.664 m)   Wt 162 lb 3.2 oz (73.6 kg)   SpO2 98%   BMI 26.58 kg/m    GEN: Well nourished, well developed in no acute distress HEENT: Normal NECK: No JVD; No carotid bruits LYMPHATICS: No lymphadenopathy CARDIAC:RRR, no murmurs, rubs, gallops RESPIRATORY:  Clear to auscultation without rales, wheezing or rhonchi  ABDOMEN: Soft, non-tender, non-distended MUSCULOSKELETAL:  No edema; No deformity  SKIN: Warm and dry NEUROLOGIC:  Alert and oriented x 3 PSYCHIATRIC:  Normal affect  ASSESSMENT & PLAN:    PSVT -She has not had any significant palpitations since I saw her last  -Continue Toprol -XL 12.5 mg daily with as needed refills  LE edema  - Edema is well-controlled with compression hose and as needed diuretics  - Continue compression hose as needed  Chronic venous insufficiency  -followed by Dr. Eliza and using knee-high compression hose   Aortic atherosclerosis/Atypical CP -noted on Chest xray -Coronary CTA 10/29/2021 demonstrated coronary calcium score of 0 with normal coronary arteries and no evidence of CAD -I do not think her atypical chest discomfort is related to her heart  Medication Adjustments/Labs and Tests Ordered: Current medicines are reviewed at length with the patient today.  Concerns regarding medicines  are outlined above.  Tests Ordered: Orders Placed This Encounter  Procedures   EKG 12-Lead   Medication Changes: No orders of the defined types were placed in this encounter.   Disposition:  Followup with me PRN  Signed, Corning Shlomo, MD  02/29/2024 9:44 AM    [facility]

## 2024-02-29 NOTE — Addendum Note (Signed)
 Addended by: JANIT GENI CROME on: 02/29/2024 09:50 AM   Modules accepted: Orders

## 2024-02-29 NOTE — Patient Instructions (Signed)
 Medication Instructions:  Your physician recommends that you continue on your current medications as directed. Please refer to the Current Medication list given to you today.  *If you need a refill on your cardiac medications before your next appointment, please call your pharmacy*  Lab Work: None.  If you have labs (blood work) drawn today and your tests are completely normal, you will receive your results only by: MyChart Message (if you have MyChart) OR A paper copy in the mail If you have any lab test that is abnormal or we need to change your treatment, we will call you to review the results.  Testing/Procedures: None.  Follow-Up: At [facility], you and your health needs are our priority.  As part of our continuing mission to provide you with exceptional heart care, our providers are all part of one team.  This team includes your primary Cardiologist (physician) and Advanced Practice Providers or APPs (Physician Assistants and Nurse Practitioners) who all work together to provide you with the care you need, when you need it.  Your next appointment will be as needed and it will be with:     Provider:   Gaylyn Keas, MD

## 2024-03-22 NOTE — Telephone Encounter (Signed)
 Spoke with patient. She saw Dr Merrilyn today in follow up and Dr Merrilyn has requested that she hold off on colonoscopy for at least another 6 weeks. Patient has been rescheduled to 06/06/24 at 9 am and she verbalizes understanding of this.

## 2024-04-13 ENCOUNTER — Other Ambulatory Visit: Payer: Self-pay | Admitting: Cardiology

## 2024-04-13 ENCOUNTER — Encounter: Admitting: Internal Medicine

## 2024-05-11 ENCOUNTER — Telehealth: Payer: Self-pay | Admitting: *Deleted

## 2024-05-11 NOTE — Telephone Encounter (Signed)
 Letter sent electronically to Dr Cy Brochure requesting surgical clearance for patient to move forward with previously scheduled 06/06/24 colonoscopy for rectal cancer history.

## 2024-05-19 NOTE — Telephone Encounter (Signed)
 Letter sent again to Dr Merrilyn via [facility].

## 2024-05-24 NOTE — Telephone Encounter (Signed)
===  View-only below this line=== ----- Message ----- From: Albertus Gordy HERO, MD Sent: 05/24/2024   1:09 PM EDT To: Naomie LOISE Sharps, RN  Move forward with the procedure JMP ----- Message ----- From: Sharps Naomie LOISE, RN Sent: 05/24/2024  11:15 AM EDT To: Gordy HERO Albertus, MD  Dr Albertus-  Originally, we had this lady scheduled for colonoscopy due to hx rectal cancer. We had to cancel her out because she was having a parastomal hernia repair by Dr Merrilyn in July and we told her we would need clearance to move forward with colon from Dr Merrilyn. Patient is currently scheduled for 10/21 colonoscopy in [facility] but following 3 attempts to get Dr Merrilyn clearance, I havent heard anything back yet. If I continue not to hear from Dr Ashburn's office, do you want me to delay colonoscopy further or just move forward as scheduled (she is 3 months out from her repair)

## 2024-06-06 ENCOUNTER — Encounter: Payer: Self-pay | Admitting: Internal Medicine

## 2024-06-06 ENCOUNTER — Ambulatory Visit (AMBULATORY_SURGERY_CENTER): Admitting: Internal Medicine

## 2024-06-06 VITALS — BP 91/60 | HR 62 | Temp 98.0°F | Resp 16 | Ht 65.5 in | Wt 159.2 lb

## 2024-06-06 DIAGNOSIS — D122 Benign neoplasm of ascending colon: Secondary | ICD-10-CM | POA: Diagnosis not present

## 2024-06-06 DIAGNOSIS — D120 Benign neoplasm of cecum: Secondary | ICD-10-CM

## 2024-06-06 DIAGNOSIS — Z1211 Encounter for screening for malignant neoplasm of colon: Secondary | ICD-10-CM

## 2024-06-06 DIAGNOSIS — K635 Polyp of colon: Secondary | ICD-10-CM | POA: Diagnosis not present

## 2024-06-06 DIAGNOSIS — Z933 Colostomy status: Secondary | ICD-10-CM | POA: Diagnosis not present

## 2024-06-06 DIAGNOSIS — Z85048 Personal history of other malignant neoplasm of rectum, rectosigmoid junction, and anus: Secondary | ICD-10-CM

## 2024-06-06 MED ORDER — SODIUM CHLORIDE 0.9 % IV SOLN: 500.0000 mL | INTRAVENOUS | Status: DC

## 2024-06-06 NOTE — Progress Notes (Signed)
 Pt's states no medical or surgical changes since previsit or office visit.

## 2024-06-06 NOTE — Progress Notes (Signed)
 A/o x 3, VSS, good SR's, pleased with anesthesia, report to RN

## 2024-06-06 NOTE — Progress Notes (Signed)
 Called to room to assist during endoscopic procedure.  Patient ID and intended procedure confirmed with present staff. Received instructions for my participation in the procedure from the performing physician.

## 2024-06-06 NOTE — Patient Instructions (Signed)
 Thank you for letting us care for your healthcare needs today! Please see handout regarding Polyps. Resume previous diet. Continue current medications. Await pathology results.  YOU HAD AN ENDOSCOPIC PROCEDURE TODAY AT THE [facility]:   Refer to the procedure report that was given to you for any specific questions about what was found during the examination.  If the procedure report does not answer your questions, please call your gastroenterologist to clarify.  If you requested that your care partner not be given the details of your procedure findings, then the procedure report has been included in a sealed envelope for you to review at your convenience later.  YOU SHOULD EXPECT: Some feelings of bloating in the abdomen. Passage of more gas than usual.  Walking can help get rid of the air that was put into your GI tract during the procedure and reduce the bloating. If you had a lower endoscopy (such as a colonoscopy or flexible sigmoidoscopy) you may notice spotting of blood in your stool or on the toilet paper. If you underwent a bowel prep for your procedure, you may not have a normal bowel movement for a few days.  Please Note:  You might notice some irritation and congestion in your nose or some drainage.  This is from the oxygen used during your procedure.  There is no need for concern and it should clear up in a day or so.  SYMPTOMS TO REPORT IMMEDIATELY:  Following lower endoscopy (colonoscopy or flexible sigmoidoscopy):  Excessive amounts of blood in the stool  Significant tenderness or worsening of abdominal pains  Swelling of the abdomen that is new, acute  Fever of 100F or higher   For urgent or emergent issues, a gastroenterologist can be reached at any hour by calling [phone]. Do not use MyChart messaging for urgent concerns.    DIET:  We do recommend a small meal at first, but then you may proceed to your regular diet.  Drink plenty of fluids but you should  avoid alcoholic beverages for 24 hours.  ACTIVITY:  You should plan to take it easy for the rest of today and you should NOT DRIVE or use heavy machinery until tomorrow (because of the sedation medicines used during the test).    FOLLOW UP: Our staff will call the number listed on your records the next business day following your procedure.  We will call around 7:15- 8:00 am to check on you and address any questions or concerns that you may have regarding the information given to you following your procedure. If we do not reach you, we will leave a message.     If any biopsies were taken you will be contacted by phone or by letter within the next 1-3 weeks.  Please call us at [phone] if you have not heard about the biopsies in 3 weeks.    SIGNATURES/CONFIDENTIALITY: You and/or your care partner have signed paperwork which will be entered into your electronic medical record.  These signatures attest to the fact that that the information above on your After Visit Summary has been reviewed and is understood.  Full responsibility of the confidentiality of this discharge information lies with you and/or your care-partner.

## 2024-06-06 NOTE — Progress Notes (Signed)
 GASTROENTEROLOGY PROCEDURE H&P NOTE   Primary Care Physician: Kip Righter, MD    Reason for Procedure:  History of rectal cancer  Plan:    Colonoscopy via stoma  Patient is appropriate for endoscopic procedure(s) in the ambulatory ([facility]) setting.  The nature of the procedure, as well as the risks, benefits, and alternatives were carefully and thoroughly reviewed with the patient. Ample time for discussion and questions allowed. The patient understood, was satisfied, and agreed to proceed.     HPI: Colleen Kirby is a 68 y.o. female who presents for colonoscopy via stoma.  Medical history as below.  Tolerated the prep.  No recent chest pain or shortness of breath.  No abdominal pain today.  Since I met her she had parastomal hernia repair with Dr. Merrilyn in July 2025.  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   Family history of breast cancer    Family history of colon cancer    Family history of testicular cancer    GERD (gastroesophageal reflux disease)    History of DVT (deep vein thrombosis)    Palpitations    Peripheral vascular disease    Personal history of chemotherapy 2011   colon   Rectal cancer [facility])     Past Surgical History:  Procedure Laterality Date   ENDOVENOUS ABLATION SAPHENOUS VEIN W/ LASER Left 09/09/2023   endovenous laser ablation left greater saphenous vein and stab phlebectomy< 10 incisions left leg by Penne Colorado MD   SMALL INTESTINE SURGERY      Prior to Admission medications   Medication Sig Start Date End Date Taking? Authorizing Provider  aspirin  EC 81 MG tablet Take 81 mg by mouth daily.   Yes [provider]  cholecalciferol (VITAMIN D) 1000 units tablet Take 2,000 Units by mouth daily.    Yes [provider]  estradiol (ESTRACE) 0.1 MG/GM vaginal cream Place 1 Applicatorful vaginally See admin instructions. Every  3 days   Yes [provider]  famotidine  (PEPCID ) 20 MG tablet Take 1 tablet (20 mg total) by mouth 2 (two) times daily as needed for heartburn or indigestion. 11/25/23  Yes Mccartney Chuba, Gordy HERO, MD  metoprolol  succinate (TOPROL -XL) 25 MG 24 hr tablet Take 0.5 tablets (12.5 mg total) by mouth daily. 04/13/24  Yes Turner, Wilbert SAUNDERS, MD  Omega-3 Fatty Acids (OMEGA 3 PO) Take 1,250 mg by mouth daily.    Yes [provider]  Probiotic Product (ALIGN PO) Take 1 tablet by mouth every other day.    Yes [provider]  acetaminophen (TYLENOL) 500 MG tablet Take 500 mg by mouth every 6 (six) hours as needed for mild pain or headache.    [provider]  docusate sodium (COLACE) 100 MG capsule Take 100 mg by mouth daily as needed for mild constipation.    [provider]  ibuprofen (ADVIL,MOTRIN) 200 MG tablet Take 200-400 mg by mouth every 6 (six) hours as needed. Pain    [provider]    Current Outpatient Medications  Medication Sig Dispense Refill   aspirin  EC 81 MG tablet Take 81 mg by mouth daily.     cholecalciferol (VITAMIN D) 1000 units tablet Take 2,000 Units by mouth daily.      estradiol (ESTRACE) 0.1 MG/GM vaginal cream Place 1 Applicatorful vaginally See admin instructions. Every 3 days     famotidine  (PEPCID )  20 MG tablet Take 1 tablet (20 mg total) by mouth 2 (two) times daily as needed for heartburn or indigestion. 60 tablet 11   metoprolol  succinate (TOPROL -XL) 25 MG 24 hr tablet Take 0.5 tablets (12.5 mg total) by mouth daily. 45 tablet 3   Omega-3 Fatty Acids (OMEGA 3 PO) Take 1,250 mg by mouth daily.      Probiotic Product (ALIGN PO) Take 1 tablet by mouth every other day.      acetaminophen (TYLENOL) 500 MG tablet Take 500 mg by mouth every 6 (six) hours as needed for mild pain or headache.     docusate sodium (COLACE) 100 MG capsule Take 100 mg by mouth daily as needed for mild constipation.     ibuprofen (ADVIL,MOTRIN) 200 MG tablet  Take 200-400 mg by mouth every 6 (six) hours as needed. Pain     Current Facility-Administered Medications  Medication Dose Route Frequency Provider Last Rate Last Admin   0.9 %  sodium chloride  infusion  500 mL Intravenous Continuous Isa Hitz, Gordy HERO, MD        Allergies as of 06/06/2024 - Review Complete 06/06/2024  Allergen Reaction Noted   Adhesive [tape] Itching and Rash 09/29/2011    Family History  Problem Relation Age of Onset   Congestive Heart Failure Mother    Hypertension Mother    Breast cancer Mother 35       & again @ 74   Heart failure Mother    Atrial fibrillation Mother    Heart disease Father    Hypertension Brother    Healthy Brother    Pneumonia Maternal Uncle        HIV related   Colon cancer Paternal Aunt        died in her 16s   Heart disease Maternal Grandmother    Breast cancer Paternal Grandmother        dx in her 67s, died at 55   Heart disease Paternal Grandfather    Testicular cancer Son 78   Prostate cancer Cousin        paternal cousin with prostate cancer in his 27s   Coronary artery disease Other    Arrhythmia Other    Esophageal cancer Neg Hx    Pancreatic cancer Neg Hx    Stomach cancer Neg Hx     Social History   Socioeconomic History   Marital status: Married    Spouse name: Not on file   Number of children: 3   Years of education: Not on file   Highest education level: Not on file  Occupational History   Not on file  Tobacco Use   Smoking status: Never   Smokeless tobacco: Never  Vaping Use   Vaping status: Never Used  Substance and Sexual Activity   Alcohol use: No   Drug use: Never   Sexual activity: Not on file  Other Topics Concern   Not on file  Social History Narrative   Not on file   Social Drivers of Health   Financial Resource Strain: Not on file  Food Insecurity: Not on file  Transportation Needs: Not on file  Physical Activity: Not on file  Stress: Not on file  Social Connections: Unknown  (07/01/2022)   [facility] from [facility]   Social Network    Social Network: Not on file  Intimate Partner Violence: Unknown (07/01/2022)   Received from [facility]   HITS    Physically Hurt: Not on file    Insult  or Talk Down To: Not on file    Threaten Physical Harm: Not on file    Scream or Curse: Not on file    Physical Exam: Vital signs in last 24 hours: @BP  128/77   Pulse 73   Temp 98 F (36.7 C)   Ht 5' 5.5 (1.664 m)   Wt 159 lb 3.2 oz (72.2 kg)   SpO2 100%   BMI 26.09 kg/m  GEN: NAD EYE: Sclerae anicteric ENT: MMM CV: Non-tachycardic Pulm: CTA b/l GI: Soft, NT/ND NEURO:  Alert & Oriented x 3   Gordy Starch, MD [facility]  06/06/2024 10:14 AM

## 2024-06-06 NOTE — Op Note (Signed)
 [facility] Patient Name: Colleen Kirby Procedure Date: 06/06/2024 10:10 AM MRN: [id number] Endoscopist: Gordy CHRISTELLA Starch , MD, [id number] Age: 68 Referring MD:  Date of Birth: [birth year] Gender: Female Account #: [id number] Procedure:                Colonoscopy Indications:              High risk colon cancer surveillance: Personal                            history of rectal cancer (2011), Last colonoscopy:                            2022 (Incomplete colonoscopy due to parastomal                            hernia; parastomal hernia repair in July 2025 with                            Dr. Merrilyn) Medicines:                Monitored Anesthesia Care Procedure:                Pre-Anesthesia Assessment:                           - Prior to the procedure, a History and Physical                            was performed, and patient medications and                            allergies were reviewed. The patient's tolerance of                            previous anesthesia was also reviewed. The risks                            and benefits of the procedure and the sedation                            options and risks were discussed with the patient.                            All questions were answered, and informed consent                            was obtained. Prior Anticoagulants: The patient has                            taken no anticoagulant or antiplatelet agents. ASA                            Grade Assessment: II - A patient with mild systemic  disease. After reviewing the risks and benefits,                            the patient was deemed in satisfactory condition to                            undergo the procedure.                           After obtaining informed consent, the colonoscope                            was passed under direct vision. Throughout the                            procedure, the patient's blood pressure, pulse, and                             oxygen saturations were monitored continuously. The                            Olympus Scope SN: [id number] was introduced through                            the sigmoid colostomy and advanced to the cecum,                            identified by appendiceal orifice and ileocecal                            valve. The colonoscopy was performed without                            difficulty. The patient tolerated the procedure                            well. The quality of the bowel preparation was                            excellent. The ileocecal valve and the appendiceal                            orifice were photographed. Scope In: 10:27:31 AM Scope Out: 10:43:31 AM Scope Withdrawal Time: 0 hours 11 minutes 0 seconds  Total Procedure Duration: 0 hours 16 minutes 0 seconds  Findings:                 There was evidence of a patent end colostomy in the                            sigmoid colon. This was characterized by healthy                            appearing mucosa.  A 6 mm polyp was found in the cecum. The polyp was                            sessile. The polyp was removed with a cold snare.                            Resection and retrieval were complete.                           A 9 mm polyp was found in the ascending colon. The                            polyp was sessile. The polyp was removed with a                            cold snare. Resection and retrieval were complete.                           The exam was otherwise without abnormality. Complications:            No immediate complications. Estimated Blood Loss:     Estimated blood loss was minimal. Impression:               - Patent end colostomy with healthy appearing                            mucosa in the sigmoid colon.                           - One 6 mm polyp in the cecum, removed with a cold                            snare. Resected and retrieved.                            - One 9 mm polyp in the ascending colon, removed                            with a cold snare. Resected and retrieved.                           - The examination was otherwise normal. Recommendation:           - Patient has a contact number available for                            emergencies. The signs and symptoms of potential                            delayed complications were discussed with the                            patient. Return to normal activities tomorrow.  Written discharge instructions were provided to the                            patient.                           - Resume previous diet.                           - Continue present medications.                           - Await pathology results.                           - Repeat colonoscopy is recommended for                            surveillance. The colonoscopy date will be                            determined after pathology results from today's                            exam become available for review. Gordy CHRISTELLA Starch, MD 06/06/2024 10:49:44 AM This report has been signed electronically.

## 2024-06-07 ENCOUNTER — Telehealth: Payer: Self-pay | Admitting: *Deleted

## 2024-06-07 NOTE — Telephone Encounter (Signed)
  Follow up Call-     06/06/2024    9:36 AM  Call back number  Post procedure Call Back phone  # [phone]  Permission to leave phone message Yes     Patient questions:  Do you have a fever, pain , or abdominal swelling? No. Pain Score  0 *  Have you tolerated food without any problems? Yes.    Have you been able to return to your normal activities? Yes.    Do you have any questions about your discharge instructions: Diet   No. Medications  No. Follow up visit  No.  Do you have questions or concerns about your Care? No.  Actions: * If pain score is 4 or above: No action needed, pain <4.

## 2024-06-08 ENCOUNTER — Ambulatory Visit: Payer: Self-pay | Admitting: Internal Medicine

## 2024-06-08 LAB — SURGICAL PATHOLOGY

## 2024-09-21 ENCOUNTER — Ambulatory Visit: Admitting: Physical Therapy

## 2024-09-27 ENCOUNTER — Ambulatory Visit: Admitting: Physical Therapy
